# Patient Record
Sex: Female | Born: 1990 | Race: Black or African American | Hispanic: No | Marital: Married | Smoking: Never smoker
Health system: Southern US, Community
[De-identification: ages and names within clinical notes are randomized; demographics above are authoritative.]

## PROBLEM LIST (undated history)

## (undated) ENCOUNTER — Inpatient Hospital Stay (HOSPITAL_COMMUNITY): Payer: Self-pay

## (undated) DIAGNOSIS — I10 Essential (primary) hypertension: Secondary | ICD-10-CM

## (undated) DIAGNOSIS — R040 Epistaxis: Secondary | ICD-10-CM

## (undated) HISTORY — DX: Essential (primary) hypertension: I10

## (undated) HISTORY — PX: WISDOM TOOTH EXTRACTION: SHX21

---

## 2014-10-08 NOTE — L&D Delivery Note (Signed)
  Vaginal Delivery Note The pt utilized an epidural as pain management.   Artificial rupture of membranes today, at 1552, clear.  GBS negative.  Cervical dilation was complete at 1810.  NICHD Category 1.    Pushing with guidance began at  1825.   After 12 minutes of pushing the head, shoulders and the body of a viable female infant "Beth Meadows" delivered spontaneously with maternal effort in the LOA position at 1837.   With vigorous tone and spontaneous cry, the infant was placed on moms abd.  After the umbilical cord was clamped it was cut by the FOB, then cord blood was obtained for evaluation. Spontaneous delivery of a intact placenta with a 3 vessel cord via [facility] [employer].   Episiotomy: None   The vulva, perineum, vaginal vault, rectum and cervix were inspected and revealed a 1 degree vaginal which was repair using a 3-0 vicryl on a CT needle and a superficial bilateral inner labial,  bilateral periurethral laceration and a periclitoral, both repaired using a 4-0 vicryl on a SH. Lidocaine was not used, the epidural was sufficient for the repair.    Postpartum pitocin as ordered.  Fundus firm, lochia minimum, bleeding under control. EBL 250, Pt hemodynamically stable.   Sponge, laps and needle count correct and verified with the primary care nurse.  Attending MD available at all times.    Routine postpartum orders   Mother desires Nexplanon for contraception   Placenta to pathology: NO     Cord Gases sent to lab: NO Cord blood sent to lab: YES   APGARS:  8 at 1 minute and 9 at 5 minutes Weight:.      Both mom and baby were left in stable condition, baby skin to skin.      Jyair Kiraly, CNM, MSN 06/01/2015. 7:32 PM

## 2014-11-01 ENCOUNTER — Inpatient Hospital Stay (HOSPITAL_COMMUNITY)
Admission: AD | Admit: 2014-11-01 | Discharge: 2014-11-01 | Disposition: A | Discharge status: Home / Self Care | Payer: Medicaid Other | Source: Ambulatory Visit | Attending: Obstetrics & Gynecology | Admitting: Obstetrics & Gynecology

## 2014-11-01 ENCOUNTER — Inpatient Hospital Stay (HOSPITAL_COMMUNITY): Payer: Medicaid Other

## 2014-11-01 ENCOUNTER — Encounter (HOSPITAL_COMMUNITY): Payer: Self-pay | Admitting: *Deleted

## 2014-11-01 DIAGNOSIS — O211 Hyperemesis gravidarum with metabolic disturbance: Secondary | ICD-10-CM | POA: Insufficient documentation

## 2014-11-01 DIAGNOSIS — O219 Vomiting of pregnancy, unspecified: Secondary | ICD-10-CM | POA: Diagnosis not present

## 2014-11-01 DIAGNOSIS — R1031 Right lower quadrant pain: Secondary | ICD-10-CM | POA: Insufficient documentation

## 2014-11-01 DIAGNOSIS — R109 Unspecified abdominal pain: Secondary | ICD-10-CM

## 2014-11-01 DIAGNOSIS — O210 Mild hyperemesis gravidarum: Secondary | ICD-10-CM | POA: Diagnosis present

## 2014-11-01 DIAGNOSIS — O26899 Other specified pregnancy related conditions, unspecified trimester: Secondary | ICD-10-CM

## 2014-11-01 DIAGNOSIS — Z3A09 9 weeks gestation of pregnancy: Secondary | ICD-10-CM | POA: Insufficient documentation

## 2014-11-01 HISTORY — DX: Epistaxis: R04.0

## 2014-11-01 LAB — URINALYSIS, ROUTINE W REFLEX MICROSCOPIC
BILIRUBIN URINE: NEGATIVE
Glucose, UA: NEGATIVE mg/dL
Hgb urine dipstick: NEGATIVE
Ketones, ur: >80 mg/dL — AB
Leukocytes, UA: NEGATIVE
Nitrite: NEGATIVE
PH: 6 (ref 5.0–8.0)
PROTEIN: NEGATIVE mg/dL
UROBILINOGEN UA: 0.2 mg/dL (ref 0.0–1.0)

## 2014-11-01 LAB — POCT PREGNANCY, URINE: Preg Test, Ur: POSITIVE — AB

## 2014-11-01 MED ORDER — PROMETHAZINE HCL 25 MG/ML IJ SOLN
25.0000 mg | Freq: Once | INTRAMUSCULAR | Status: AC
  Administered 2014-11-01: 25 mg via INTRAVENOUS
  Filled 2014-11-01: qty 1

## 2014-11-01 MED ORDER — LACTATED RINGERS IV BOLUS (SEPSIS)
1000.0000 mL | Freq: Once | INTRAVENOUS | Status: AC
  Administered 2014-11-01: 1000 mL via INTRAVENOUS

## 2014-11-01 MED ORDER — DOXYLAMINE-PYRIDOXINE 10-10 MG PO TBEC: 2.0000 | DELAYED_RELEASE_TABLET | Freq: Every evening | ORAL | Status: DC | PRN

## 2014-11-01 MED ORDER — LACTATED RINGERS IV SOLN
INTRAVENOUS | Status: DC
  Administered 2014-11-01: 14:00:00 via INTRAVENOUS

## 2014-11-01 NOTE — MAU Note (Signed)
+  HPT, confirmed a couple wks ago at urgent care.  Has been vomiting several times a day, can't keep any food down.

## 2014-11-01 NOTE — MAU Provider Note (Signed)
History     CSN: [id number]  Arrival date and time: 11/01/14 1226   None     Chief Complaint  Patient presents with  . Emesis   HPI This is a 24 y.o. female at [redacted]w[redacted]d who presents with c/o nausea and vomting which has worsened this week. Has been a problem for 2 weeks but got worse today. States was seen at Urgent care 2 wks ago for RLQ pain. They did Quant HCG levels and states they did double. But they never did an [place] to r/o ectopic.   RN Note: +HPT, confirmed a couple wks ago at urgent care. Has been vomiting several times a day, can't keep any food down.          OB History    Gravida Para Term Preterm AB TAB SAB Ectopic Multiple Living   1               Past Medical History  Diagnosis Date  . Frequent nosebleeds     Past Surgical History  Procedure Laterality Date  . Wisdom tooth extraction      History reviewed. No pertinent family history.  History  Substance Use Topics  . Smoking status: Never Smoker   . Smokeless tobacco: Never Used  . Alcohol Use: No    Allergies: No Known Allergies  No prescriptions prior to admission    Review of Systems  Constitutional: Positive for malaise/fatigue. Negative for fever and chills.  Gastrointestinal: Positive for nausea, vomiting and abdominal pain (Intermittent RLQ pain, not often now).  Genitourinary:       Denies bleeding   Neurological: Negative for dizziness and headaches.   Physical Exam   Blood pressure 134/77, pulse 94, temperature 99.1 F (37.3 C), temperature source Oral, resp. rate 18, weight 165 lb (74.844 kg), last menstrual period 09/01/2014.  Physical Exam  Constitutional: She is oriented to person, place, and time. She appears well-developed and well-nourished. No distress.  HENT:  Head: Normocephalic.  Cardiovascular: Normal rate.   Respiratory: Effort normal.  GI: Soft. She exhibits no distension. There is no tenderness. There is no rebound and no guarding.  Musculoskeletal:  Normal range of motion.  Neurological: She is alert and oriented to person, place, and time.  Skin: Skin is warm and dry.  Psychiatric: She has a normal mood and affect.    MAU Course  Procedures  MDM Will give IV hydration and Phenergan. Then will d/c home with Rx for Diclegis  >> feels better, tolerating PO Will go ahead and get an [place] to confirm this pregnancy is intrauterine.  US Ob Comp Less 14 Wks  11/01/2014   CLINICAL DATA:  Pregnant, right lower quadrant pain  EXAM: OBSTETRIC <14 WK [place] AND TRANSVAGINAL OB US  TECHNIQUE: Both transabdominal and transvaginal ultrasound examinations were performed for complete evaluation of the gestation as well as the maternal uterus, adnexal regions, and pelvic cul-de-sac. Transvaginal technique was performed to assess early pregnancy.  COMPARISON:  None.  FINDINGS: Intrauterine gestational sac: Visualized/normal in shape.  Yolk sac:  Present  Embryo:  Present  Cardiac Activity: Present  Heart Rate:  172 bpm  CRL:   25.2  mm   9 w 2 d                  [place] EDC: 06/04/2015  Maternal uterus/adnexae: No subchorionic hemorrhage.  Left ovary is within normal limits. Right ovary is not discretely visualized.  No free fluid.  IMPRESSION: Single live intrauterine  gestation with estimated gestational age [redacted] weeks 2 days by crown-rump length.   Electronically Signed   By: Charline BillsSriyesh  Krishnan M.D.   On: 11/01/2014 14:44   [place] Ob Transvaginal  11/01/2014   CLINICAL DATA:  Pregnant, right lower quadrant pain  EXAM: OBSTETRIC <14 WK US AND TRANSVAGINAL OB US  TECHNIQUE: Both transabdominal and transvaginal ultrasound examinations were performed for complete evaluation of the gestation as well as the maternal uterus, adnexal regions, and pelvic cul-de-sac. Transvaginal technique was performed to assess early pregnancy.  COMPARISON:  None.  FINDINGS: Intrauterine gestational sac: Visualized/normal in shape.  Yolk sac:  Present  Embryo:  Present  Cardiac Activity: Present  Heart  Rate:  172 bpm  CRL:   25.2  mm   9 w 2 d                  US EDC: 06/04/2015  Maternal uterus/adnexae: No subchorionic hemorrhage.  Left ovary is within normal limits. Right ovary is not discretely visualized.  No free fluid.  IMPRESSION: Single live intrauterine gestation with estimated gestational age [redacted] weeks 2 days by crown-rump length.   Electronically Signed   By: Charline BillsSriyesh  Krishnan M.D.   On: 11/01/2014 14:44    Assessment and Plan  A:  SIUP at 975w2d      Nausea and vomiting of pregnancy      Mild to moderate dehydration  P:  Discharge home      Encouraged to start prenatal care  [facility] HospitalWILLIAMS,Kreig Parson 11/01/2014, 1:52 PM

## 2014-11-01 NOTE — Discharge Instructions (Signed)
Morning Sickness °Morning sickness is when you feel sick to your stomach (nauseous) during pregnancy. This nauseous feeling may or may not come with vomiting. It often occurs in the morning but can be a problem any time of day. Morning sickness is most common during the first trimester, but it may continue throughout pregnancy. While morning sickness is unpleasant, it is usually harmless unless you develop severe and continual vomiting (hyperemesis gravidarum). This condition requires more intense treatment.  °CAUSES  °The cause of morning sickness is not completely known but seems to be related to normal hormonal changes that occur in pregnancy. °RISK FACTORS °You are at greater risk if you: °· Experienced nausea or vomiting before your pregnancy. °· Had morning sickness during a previous pregnancy. °· Are pregnant with more than one baby, such as twins. °TREATMENT  °Do not use any medicines (prescription, over-the-counter, or herbal) for morning sickness without first talking to your health care provider. Your health care provider may prescribe or recommend: °· Vitamin B6 supplements. °· Anti-nausea medicines. °· The herbal medicine ginger. °HOME CARE INSTRUCTIONS  °· Only take over-the-counter or prescription medicines as directed by your health care provider. °· Taking multivitamins before getting pregnant can prevent or decrease the severity of morning sickness in most women. °· Eat a piece of dry toast or unsalted crackers before getting out of bed in the morning. °· Eat five or six small meals a day. °· Eat dry and bland foods (rice, baked potato). Foods high in carbohydrates are often helpful. °· Do not drink liquids with your meals. Drink liquids between meals. °· Avoid greasy, fatty, and spicy foods. °· Get someone to cook for you if the smell of any food causes nausea and vomiting. °· If you feel nauseous after taking prenatal vitamins, take the vitamins at night or with a snack.  °· Snack on protein  foods (nuts, yogurt, cheese) between meals if you are hungry. °· Eat unsweetened gelatins for desserts. °· Wearing an acupressure wristband (worn for sea sickness) may be helpful. °· Acupuncture may be helpful. °· Do not smoke. °· Get a humidifier to keep the air in your house free of odors. °· Get plenty of fresh air. °SEEK MEDICAL CARE IF:  °· Your home remedies are not working, and you need medicine. °· You feel dizzy or lightheaded. °· You are losing weight. °SEEK IMMEDIATE MEDICAL CARE IF:  °· You have persistent and uncontrolled nausea and vomiting. °· You pass out (faint). °MAKE SURE YOU: °· Understand these instructions. °· Will watch your condition. °· Will get help right away if you are not doing well or get worse. °Document Released: 11/15/2006 Document Revised: 09/29/2013 Document Reviewed: 03/11/2013 °ExitCare® Patient Information ©2015 [employer]. This information is not intended to replace advice given to you by your health care provider. Make sure you discuss any questions you have with your health care provider. ° ° °DICLEGIS INSTRUCTIONS: ° °Take 2 tablets at bedtime.  ° °If symptoms persist, add 1 tab in the AM starting on day 3. ° ° If symptoms persist, add 1 tab in the PM starting day 4.  ° ° °

## 2014-12-16 LAB — OB RESULTS CONSOLE HEPATITIS B SURFACE ANTIGEN: HEP B S AG: NEGATIVE

## 2014-12-16 LAB — OB RESULTS CONSOLE GC/CHLAMYDIA
Chlamydia: NEGATIVE
Gonorrhea: NEGATIVE

## 2014-12-16 LAB — OB RESULTS CONSOLE RPR: RPR: NONREACTIVE

## 2014-12-16 LAB — OB RESULTS CONSOLE ABO/RH: RH Type: POSITIVE

## 2014-12-16 LAB — OB RESULTS CONSOLE ANTIBODY SCREEN: ANTIBODY SCREEN: NEGATIVE

## 2014-12-16 LAB — OB RESULTS CONSOLE HIV ANTIBODY (ROUTINE TESTING): HIV: NONREACTIVE

## 2014-12-16 LAB — OB RESULTS CONSOLE RUBELLA ANTIBODY, IGM: RUBELLA: IMMUNE

## 2015-04-29 LAB — OB RESULTS CONSOLE GBS: GBS: NEGATIVE

## 2015-06-01 ENCOUNTER — Inpatient Hospital Stay (HOSPITAL_COMMUNITY): Payer: Medicaid Other | Admitting: Anesthesiology

## 2015-06-01 ENCOUNTER — Inpatient Hospital Stay (HOSPITAL_COMMUNITY)
Admission: AD | Admit: 2015-06-01 | Discharge: 2015-06-03 | DRG: 775 | Disposition: A | Discharge status: Home / Self Care | Payer: Medicaid Other | Source: Ambulatory Visit | Attending: Obstetrics & Gynecology | Admitting: Obstetrics & Gynecology

## 2015-06-01 ENCOUNTER — Encounter (HOSPITAL_COMMUNITY): Payer: Self-pay | Admitting: *Deleted

## 2015-06-01 DIAGNOSIS — O368132 Decreased fetal movements, third trimester, fetus 2: Secondary | ICD-10-CM

## 2015-06-01 DIAGNOSIS — Z3A39 39 weeks gestation of pregnancy: Secondary | ICD-10-CM | POA: Diagnosis present

## 2015-06-01 LAB — COMPREHENSIVE METABOLIC PANEL
ALK PHOS: 117 U/L (ref 38–126)
ALT: 11 U/L — ABNORMAL LOW (ref 14–54)
ANION GAP: 9 (ref 5–15)
AST: 19 U/L (ref 15–41)
Albumin: 3.2 g/dL — ABNORMAL LOW (ref 3.5–5.0)
BILIRUBIN TOTAL: 0.6 mg/dL (ref 0.3–1.2)
BUN: 7 mg/dL (ref 6–20)
CALCIUM: 9.1 mg/dL (ref 8.9–10.3)
CO2: 21 mmol/L — ABNORMAL LOW (ref 22–32)
Chloride: 105 mmol/L (ref 101–111)
Creatinine, Ser: 0.71 mg/dL (ref 0.44–1.00)
Glucose, Bld: 108 mg/dL — ABNORMAL HIGH (ref 65–99)
POTASSIUM: 3.5 mmol/L (ref 3.5–5.1)
Sodium: 135 mmol/L (ref 135–145)
TOTAL PROTEIN: 6.8 g/dL (ref 6.5–8.1)

## 2015-06-01 LAB — PROTEIN / CREATININE RATIO, URINE
CREATININE, URINE: 144 mg/dL
Protein Creatinine Ratio: 0.06 mg/mg{Cre} (ref 0.00–0.15)
TOTAL PROTEIN, URINE: 9 mg/dL

## 2015-06-01 LAB — URIC ACID: URIC ACID, SERUM: 4.7 mg/dL (ref 2.3–6.6)

## 2015-06-01 LAB — CBC
HEMATOCRIT: 36.4 % (ref 36.0–46.0)
HEMOGLOBIN: 12.9 g/dL (ref 12.0–15.0)
MCH: 31.9 pg (ref 26.0–34.0)
MCHC: 35.4 g/dL (ref 30.0–36.0)
MCV: 90.1 fL (ref 78.0–100.0)
Platelets: 177 10*3/uL (ref 150–400)
RBC: 4.04 MIL/uL (ref 3.87–5.11)
RDW: 14.1 % (ref 11.5–15.5)
WBC: 12.7 10*3/uL — ABNORMAL HIGH (ref 4.0–10.5)

## 2015-06-01 LAB — TYPE AND SCREEN
ABO/RH(D): O POS
ANTIBODY SCREEN: NEGATIVE

## 2015-06-01 LAB — ABO/RH: ABO/RH(D): O POS

## 2015-06-01 LAB — LACTATE DEHYDROGENASE: LDH: 141 U/L (ref 98–192)

## 2015-06-01 MED ORDER — OXYCODONE-ACETAMINOPHEN 5-325 MG PO TABS: 1.0000 | ORAL_TABLET | ORAL | Status: DC | PRN

## 2015-06-01 MED ORDER — FENTANYL 2.5 MCG/ML BUPIVACAINE 1/10 % EPIDURAL INFUSION (WH - ANES)
14.0000 mL/h | INTRAMUSCULAR | Status: DC | PRN
  Administered 2015-06-01 (×2): 14 mL/h via EPIDURAL
  Filled 2015-06-01 (×2): qty 125

## 2015-06-01 MED ORDER — DIPHENHYDRAMINE HCL 25 MG PO CAPS: 25.0000 mg | ORAL_CAPSULE | Freq: Four times a day (QID) | ORAL | Status: DC | PRN

## 2015-06-01 MED ORDER — ZOLPIDEM TARTRATE 5 MG PO TABS: 5.0000 mg | ORAL_TABLET | Freq: Every evening | ORAL | Status: DC | PRN

## 2015-06-01 MED ORDER — LIDOCAINE HCL (PF) 1 % IJ SOLN
30.0000 mL | INTRAMUSCULAR | Status: DC | PRN
  Filled 2015-06-01: qty 30

## 2015-06-01 MED ORDER — ONDANSETRON HCL 4 MG PO TABS: 4.0000 mg | ORAL_TABLET | ORAL | Status: DC | PRN

## 2015-06-01 MED ORDER — FLEET ENEMA 7-19 GM/118ML RE ENEM: 1.0000 | ENEMA | RECTAL | Status: DC | PRN

## 2015-06-01 MED ORDER — OXYCODONE-ACETAMINOPHEN 5-325 MG PO TABS: 2.0000 | ORAL_TABLET | ORAL | Status: DC | PRN

## 2015-06-01 MED ORDER — ACETAMINOPHEN 325 MG PO TABS
650.0000 mg | ORAL_TABLET | ORAL | Status: DC | PRN
Start: 2015-06-01 — End: 2015-06-01

## 2015-06-01 MED ORDER — OXYTOCIN BOLUS FROM INFUSION: 500.0000 mL | INTRAVENOUS | Status: DC

## 2015-06-01 MED ORDER — CITRIC ACID-SODIUM CITRATE 334-500 MG/5ML PO SOLN: 30.0000 mL | ORAL | Status: DC | PRN

## 2015-06-01 MED ORDER — LIDOCAINE HCL (PF) 1 % IJ SOLN
INTRAMUSCULAR | Status: DC | PRN
  Administered 2015-06-01: 6 mL via EPIDURAL
  Administered 2015-06-01: 4 mL

## 2015-06-01 MED ORDER — PHENYLEPHRINE 40 MCG/ML (10ML) SYRINGE FOR IV PUSH (FOR BLOOD PRESSURE SUPPORT)
80.0000 ug | PREFILLED_SYRINGE | INTRAVENOUS | Status: DC | PRN
  Filled 2015-06-01: qty 20
  Filled 2015-06-01: qty 2

## 2015-06-01 MED ORDER — LACTATED RINGERS IV SOLN
INTRAVENOUS | Status: DC
  Administered 2015-06-01 (×2): via INTRAVENOUS

## 2015-06-01 MED ORDER — ONDANSETRON HCL 4 MG/2ML IJ SOLN
4.0000 mg | Freq: Four times a day (QID) | INTRAMUSCULAR | Status: DC | PRN
  Administered 2015-06-01: 4 mg via INTRAVENOUS
  Filled 2015-06-01: qty 2

## 2015-06-01 MED ORDER — OXYTOCIN 40 UNITS IN LACTATED RINGERS INFUSION - SIMPLE MED
62.5000 mL/h | INTRAVENOUS | Status: DC
  Filled 2015-06-01: qty 1000

## 2015-06-01 MED ORDER — DIBUCAINE 1 % RE OINT: 1.0000 "application " | TOPICAL_OINTMENT | RECTAL | Status: DC | PRN

## 2015-06-01 MED ORDER — FENTANYL 2.5 MCG/ML BUPIVACAINE 1/10 % EPIDURAL INFUSION (WH - ANES): 14.0000 mL/h | INTRAMUSCULAR | Status: DC | PRN

## 2015-06-01 MED ORDER — ONDANSETRON HCL 4 MG/2ML IJ SOLN: 4.0000 mg | INTRAMUSCULAR | Status: DC | PRN

## 2015-06-01 MED ORDER — BENZOCAINE-MENTHOL 20-0.5 % EX AERO
1.0000 "application " | INHALATION_SPRAY | CUTANEOUS | Status: DC | PRN
  Administered 2015-06-02: 1 via TOPICAL
  Filled 2015-06-01: qty 56

## 2015-06-01 MED ORDER — SENNOSIDES-DOCUSATE SODIUM 8.6-50 MG PO TABS
2.0000 | ORAL_TABLET | ORAL | Status: DC
  Administered 2015-06-02: 2 via ORAL
  Filled 2015-06-01: qty 2

## 2015-06-01 MED ORDER — PRENATAL MULTIVITAMIN CH
1.0000 | ORAL_TABLET | Freq: Every day | ORAL | Status: DC
  Administered 2015-06-02: 1 via ORAL
  Filled 2015-06-01: qty 1

## 2015-06-01 MED ORDER — WITCH HAZEL-GLYCERIN EX PADS: 1.0000 "application " | MEDICATED_PAD | CUTANEOUS | Status: DC | PRN

## 2015-06-01 MED ORDER — MISOPROSTOL 200 MCG PO TABS
ORAL_TABLET | ORAL | Status: AC
  Filled 2015-06-01: qty 5

## 2015-06-01 MED ORDER — DIPHENHYDRAMINE HCL 50 MG/ML IJ SOLN: 12.5000 mg | INTRAMUSCULAR | Status: DC | PRN

## 2015-06-01 MED ORDER — IBUPROFEN 600 MG PO TABS
600.0000 mg | ORAL_TABLET | Freq: Four times a day (QID) | ORAL | Status: DC
  Administered 2015-06-02 – 2015-06-03 (×6): 600 mg via ORAL
  Filled 2015-06-01 (×6): qty 1

## 2015-06-01 MED ORDER — LANOLIN HYDROUS EX OINT: TOPICAL_OINTMENT | CUTANEOUS | Status: DC | PRN

## 2015-06-01 MED ORDER — EPHEDRINE 5 MG/ML INJ
10.0000 mg | INTRAVENOUS | Status: DC | PRN
  Filled 2015-06-01: qty 2

## 2015-06-01 MED ORDER — ACETAMINOPHEN 325 MG PO TABS: 650.0000 mg | ORAL_TABLET | ORAL | Status: DC | PRN

## 2015-06-01 MED ORDER — SIMETHICONE 80 MG PO CHEW: 80.0000 mg | CHEWABLE_TABLET | ORAL | Status: DC | PRN

## 2015-06-01 MED ORDER — LACTATED RINGERS IV SOLN: 500.0000 mL | INTRAVENOUS | Status: DC | PRN

## 2015-06-01 MED ORDER — TETANUS-DIPHTH-ACELL PERTUSSIS 5-2.5-18.5 LF-MCG/0.5 IM SUSP: 0.5000 mL | Freq: Once | INTRAMUSCULAR | Status: DC

## 2015-06-01 NOTE — Progress Notes (Signed)
Labor Progress  Subjective: Comfortable with epidural, c/o increase pressure  Objective: BP 128/75 mmHg  Pulse 92  Temp(Src) 98.2 F (36.8 C) (Oral)  Resp 18  Ht  (1.676 m)  Wt 191 lb (86.637 kg)  BMI 30.84 kg/m2  SpO2 100%  LMP 09/01/2014 (Exact Date)     FHT: 140, moderate variability, + accel, no decel CTX:  regular, every 3-4 minutes Uterus gravid, soft non tender SVE:  8/100/-1   Assessment:  IUP at 39.1 weeks NICHD: Category  Membranes:  AROM at 1552 Labor progress: adquate labor GBS: negative    Plan: Continue labor plan Continuous monitoring Rest Ambulate Frequent position changes to facilitate fetal rotation and descent. Will reassess with cervical exam at 1700 or earlier if necessary      Beth Meadows, CNM, MSN 06/01/2015. 5:03 PM

## 2015-06-01 NOTE — Anesthesia Preprocedure Evaluation (Signed)

## 2015-06-01 NOTE — MAU Provider Note (Signed)
Beth Meadows is a 24 y.o. G1P0 at 39.0 weeks ctx since 1am   History     There are no active problems to display for this patient.   Chief Complaint  Patient presents with  . Labor Eval   HPI  OB History    Gravida Para Term Preterm AB TAB SAB Ectopic Multiple Living   1               Past Medical History  Diagnosis Date  . Frequent nosebleeds     Past Surgical History  Procedure Laterality Date  . Wisdom tooth extraction      History reviewed. No pertinent family history.  Social History  Substance Use Topics  . Smoking status: Never Smoker   . Smokeless tobacco: Never Used  . Alcohol Use: No    Allergies: No Known Allergies  Prescriptions prior to admission  Medication Sig Dispense Refill Last Dose  . IRON PO Take 1 tablet by mouth daily.   05/31/2015 at Unknown time  . Prenatal Vit-Fe Fumarate-FA (PRENATAL MULTIVITAMIN) TABS tablet Take 1 tablet by mouth at bedtime.   05/31/2015 at Unknown time  . Doxylamine-Pyridoxine (DICLEGIS) 10-10 MG TBEC Take 2 tablets by mouth at bedtime as needed. (Patient not taking: Reported on 06/01/2015) 100 tablet 2     ROS See HPI above, all other systems are negative  Physical Exam   Blood pressure 142/90, pulse 111, temperature 97.8 F (36.6 C), temperature source Oral, resp. rate 18, last menstrual period 09/01/2014.  Physical Exam Ext:  WNL ABD: Soft, non tender to palpation, no rebound or guarding SVE: 3/100/-3, anterior   ED Course  Assessment: IUP at  39.0weeks Membranes: intact FHR: Category 2 CTX:  occasional   Plan:  Labs: PIH labs Admit to L&D   Cydnee Fuquay, CNM, MSN 06/01/2015. 9:00 AM

## 2015-06-01 NOTE — Anesthesia Procedure Notes (Signed)

## 2015-06-01 NOTE — H&P (Signed)
Beth Meadows is a 24 y.o. female, G1 P0 at 39.1 weeks  There are no active problems to display for this patient.   Pregnancy Course: Patient entered care at 16.3 weeks.   EDC of 06/07/15 was established by [place].      [place] evaluations:   9.2 weeks - Dating: YS present, FHR 172, EGA 9.2, no SCH  20.0 weeks - Anatomy: EFW 11oz - 80%, FHR 150, cervix 3.52, posterior placenta, no previa, female        Significant prenatal events:   anemia   Last evaluation:   39.0 weeks   VE:0/10/-3   Reason for admission:  labor  Pt States:   Contractions Frequency: 3-4         Contraction severity: mild         Fetal activity: +FM  OB History    Gravida Para Term Preterm AB TAB SAB Ectopic Multiple Living   1              Past Medical History  Diagnosis Date  . Frequent nosebleeds    Past Surgical History  Procedure Laterality Date  . Wisdom tooth extraction     Family History: family history is not on file. Social History:  reports that she has never smoked. She has never used smokeless tobacco. She reports that she does not drink alcohol or use illicit drugs.   Prenatal Transfer Tool  Maternal Diabetes: No Genetic Screening:  Maternal Ultrasounds/Referrals: Normal Fetal Ultrasounds or other Referrals:  None Maternal Substance Abuse:  No Significant Maternal Medications:  None Significant Maternal Lab Results: None   ROS:  See HPI above, all other systems are negative  No Known Allergies  Dilation: 3.5 Effacement (%): 70 Station: -3 Exam by:: Dorrene German RN Blood pressure 120/68, pulse 126, temperature 97.8 F (36.6 C), temperature source Oral, resp. rate 18, last menstrual period 09/01/2014.  Maternal Exam:  Uterine Assessment: Contraction frequency is rare.  Abdomen: Gravid, non tender. Fundal height is aga.  Normal external genitalia, vulva, cervix, uterus and adnexa.  No lesions noted on exam.  Pelvis adequate for delivery.  Fetal presentation: Vertex by Leopold's    Fetal Exam:  Monitor Surveillance : Continuous Monitoring Mode: Ultrasound.  NICHD: Category 2, min-mod variability, + accel, no decel CTXs: Q 3-65minutes mild EFW   7 lbs  Physical Exam: Nursing note and vitals reviewed General: alert and cooperative She appears well nourished Psychiatric: Normal mood and affect. Her behavior is normal Head: Normocephalic Eyes: Pupils are equal, round, and reactive to light Neck: Normal range of motion Cardiovascular: RRR without murmur  Respiratory: CTAB. Effort normal  Abd: soft, non-tender, +BS, no rebound, no guarding  Genitourinary: Vagina normal  Neurological: A&Ox3 Skin: Warm and dry  Musculoskeletal: Normal range of motion  Homan's sign negative bilaterally No evidence of DVTs.  Edema: Minimal bilaterally non-pitting edema DTR: 2+ Clonus: None   Prenatal labs: ABO, Rh:  O positive Antibody:  negative Rubella:   immune RPR:   NR HBsAg:   negative HIV:   NR GBS:  negative Sickle cell/Hgb electrophoresis:  WNL Pap:  neg with fungal infection 12/29/14 GC:   negative Chlamydia: negative Genetic screenings:   Glucola:  negative   Assessment:  IUP at 39.0 weeks NICHD: Category Membranes: BBW GBS negative  Plan:  Admit to L&D for expectant management of labor. Possible augmentation options reviewed including AROM and/or pitocin.  IV pain medication per orders PRN Epidural per patient request Foley cath after patient  is comfortable with epidural Anticipate SVD Labor mgmt as ordered Okay to ambulate around unit with wireless monitors  Okay to get up and shower without monitoring   May auscultate FHR intermittently,  if expectant management     q 30 min in active labor - x 5 minutes     q 15 min in transition - before during and after a ctx     q 5 min with pushing - before during and after a ctx.     May ambulate without monitoring.     If no active labor, may do NST q 2 hours.      Attending MD available at  all times.  Bijal Siglin, CNM, MSN 06/01/2015, 9:19 AM

## 2015-06-01 NOTE — MAU Note (Signed)
Pt C/O regular uc's since 0100 this a.m., denies bleeding or LOF.

## 2015-06-02 LAB — CBC
HCT: 30.2 % — ABNORMAL LOW (ref 36.0–46.0)
HEMOGLOBIN: 10.6 g/dL — AB (ref 12.0–15.0)
MCH: 31.7 pg (ref 26.0–34.0)
MCHC: 35.1 g/dL (ref 30.0–36.0)
MCV: 90.4 fL (ref 78.0–100.0)
PLATELETS: 169 10*3/uL (ref 150–400)
RBC: 3.34 MIL/uL — ABNORMAL LOW (ref 3.87–5.11)
RDW: 14.3 % (ref 11.5–15.5)
WBC: 19.1 10*3/uL — ABNORMAL HIGH (ref 4.0–10.5)

## 2015-06-02 LAB — RPR: RPR: NONREACTIVE

## 2015-06-02 MED ORDER — FERROUS SULFATE 325 (65 FE) MG PO TABS
325.0000 mg | ORAL_TABLET | Freq: Two times a day (BID) | ORAL | Status: DC
  Administered 2015-06-02: 325 mg via ORAL
  Filled 2015-06-02 (×2): qty 1

## 2015-06-02 NOTE — Progress Notes (Addendum)
Beth Meadows   Subjective: Post Partum Day 1 Vaginal delivery, 1 degree vaginal, a superficial bilateral inner labial, bilateral periurethral laceration and a periclitoral Patient up ad lib, denies syncope or dizziness. Reports consuming regular diet without issues and denies N/V No issues with urination and reports bleeding is appropriate  Feeding:  breast Contraceptive plan:   Nexplanon  Objective: Temp:  [97.7 F (36.5 C)-98.9 F (37.2 C)] 98.4 F (36.9 C) (08/25 0525) Pulse Rate:  [91-226] 105 (08/25 0525) Resp:  [18-20] 18 (08/25 0525) BP: (110-160)/(61-105) 111/61 mmHg (08/25 0525) SpO2:  [90 %-100 %] 100 % (08/24 1238) Weight:  [191 lb (86.637 kg)] 191 lb (86.637 kg) (08/24 1111)  Physical Exam:  General: alert and cooperative Ext: WNL, no significant  edema. No evidence of DVT seen on physical exam. Breast: Soft filling Lungs: CTAB Heart RRR without murmur  Abdomen:  Soft, fundus firm, lochia scant, + bowel sounds, non distended, non tender Lochia: appropriate Uterine Fundus: firm Laceration: healing well    Recent Labs  06/01/15 0820 06/02/15 0620  HGB 12.9 10.6*  HCT 36.4 30.2*    Assessment S/P Vaginal Delivery-Day 1 Stable  Normal Involution Breastfeeding   Plan: Continue current care Plan for discharge tomorrow and Breastfeeding Lactation support Iron supplement   Ambera Fedele, CNM, MSN 06/02/2015, 7:55 AM

## 2015-06-02 NOTE — Lactation Note (Signed)
This note was copied from the chart of Beth Meadows. Lactation Consultation Note  Patient Name: Beth Meadows [id number]'[id number] Date: 06/02/2015 Reason for consult: Initial assessment with this first time mom of a term baby, now 96 hours old. MOm is doing very well with breastfeeding. She has been latching in cradle and reports her nipple being painful at times. I showed mom how to latch in both football and cross cradle, and how to obtain a deep latch. Mom very receptive to teaching. Lactation services reviewed with mom, as well as the breastfeeding pages in the Baby and me book. Mom knows to call for questions/concerns.    Maternal Data Formula Feeding for Exclusion: No Has patient been taught Hand Expression?: Yes Does the patient have breastfeeding experience prior to this delivery?: No  Feeding Feeding Type: Breast Fed Length of feed: 10 min  LATCH Score/Interventions                      Lactation Tools Discussed/Used     Consult Status Consult Status: Follow-up Date: 06/03/15 Follow-up type: In-patient    Alfred Levins 06/02/2015, 4:35 PM

## 2015-06-02 NOTE — Progress Notes (Signed)
UR chart review completed.  

## 2015-06-02 NOTE — Anesthesia Postprocedure Evaluation (Signed)
Anesthesia Post Note  Patient: Beth Meadows  Procedure(s) Performed: * No procedures listed *  Anesthesia type: Epidural  Patient location: Mother/Baby  Post pain: Pain level controlled  Post assessment: Post-op Vital signs reviewed  Last Vitals:  Filed Vitals:   06/02/15 0525  BP: 111/61  Pulse: 105  Temp: 36.9 C  Resp: 18    Post vital signs: Reviewed  Level of consciousness: awake  Complications: No apparent anesthesia complications

## 2015-06-03 LAB — CCBB MATERNAL DONOR DRAW

## 2015-06-03 MED ORDER — IBUPROFEN 600 MG PO TABS: 600.0000 mg | ORAL_TABLET | Freq: Four times a day (QID) | ORAL | Status: DC | PRN

## 2015-06-03 NOTE — Discharge Summary (Signed)
Vaginal Delivery Discharge Summary  Beth Meadows  DOB:    June 16, 1991 MRN:    [id number] CSN:    [id number]  Date of admission:                  June 01, 2015  Date of discharge:                   June 03, 2015  Procedures this admission:   SVD with Repair of 1st Degree Vaginal, Bilateral Labial, Bilateral Periurethral, and Periclitoral Lacerations  Date of Delivery: June 01, 2015  Newborn Data:  Live born female  Birth Weight: 7 lb 2.2 oz (3238 g) APGAR: 8, 9  Home with mother. Name: [place] Circumcision Plan: N/A  History of Present Illness:  Beth Meadows is a 24 y.o. female, G1P1001, who presents at [redacted]w[redacted]d weeks gestation. The patient has been followed at [facility]. She was admitted onset of labor. Her pregnancy has been complicated by:  Patient Active Problem List   Diagnosis Date Noted  . Obstetric vaginal laceration, delivered, current hospitalization 06/03/2015  . Periurethral trauma during delivery 06/03/2015  . Obstetric labial laceration, delivered, current hospitalization 06/03/2015  . Obstetrical laceration 06/03/2015  . Vaginal delivery 06/01/2015     Hospital Course:  Admitted in active labor. Negative GBS. Progressed  With AROM for augmentation. Utilized epidural for pain management.  Delivery was performed by V. Standard, CNm without complication. Patient and baby tolerated the procedure without difficulty, with  multiple lacerations noted. Infant status was stable and remained in room with mother.  Mother and infant then had an uncomplicated postpartum course, with breast feeding going well. Mom's physical exam was WNL, and she was discharged home in stable condition. Contraception plan was Nexplanon.  She received adequate benefit from po pain medications.   Feeding:  breast  Contraception:  Nexplanon  Hemoglobin Results:  CBC Latest Ref Rng 06/02/2015 06/01/2015   WBC 4.0 - 10.5 K/uL 19.1(H) 12.7(H)  Hemoglobin 12.0 - 15.0 g/dL 10.6(L) 12.9  Hematocrit 36.0 - 46.0 % 30.2(L) 36.4  Platelets 150 - 400 K/uL 169 177     Discharge Physical Exam:   General: alert, cooperative and fatigued Mood/Affect: Appropriate/Tired Chest: clear to auscultation, no wheezes, rales or rhonchi, symmetric air entry.  CVS exam: normal rate and regular rhythm. Breast exam: not examined. Abdomen:  + bowel sounds, soft, NT Uterine Fundus: firm, U/-3 Lochia: appropriate Laceration: Healing Well DVT Evaluation: Negative Homan's sign. No cords or calf tenderness. No significant calf/ankle edema. Skin exam: Warm, Dry.  Procedures &/or Complications: Intrapartum Procedures: spontaneous vaginal delivery Postpartum Procedures: none Complications-Operative and Postpartum: vaginal and periurethral, labial, and periclitoral laceration   Discharge Information:  Diagnoses: Term Pregnancy-delivered Activity:  pelvic rest Diet:   routine Medications: PNV and Ibuprofen Condition: stable Instructions:  Pain Management, Peri-Care, Breastfeeding, Who and When to call for postpartum complications. Information Sheet(s) given PPD&BB, Iron Rich Diet  Discharge to: home  Follow-up Information    Follow up with [facility]. Schedule an appointment as soon as possible for a visit in 6 weeks.   Specialty:  Obstetrics and Gynecology   Why:  Please call if you have any questions or concerns, prior to your next visit.   Contact information:   [address] [place] [id number] [phone]       Marlene Bast MSN, CNM 06/03/2015 8:50 AM

## 2015-06-03 NOTE — Lactation Note (Signed)
This note was copied from the chart of Beth Meadows. Lactation Consultation Note  Patient Name: Beth Meadows [id number] Date: 06/03/2015 Reason for consult: Follow-up assessment;Other (Comment) (4% weight loss )  Baby is 38 hours old at 4% weight loss,breast feeding Range - 20 -60 mins , 3 Voids , 2 stools. Per mom the baby has voided more than 3 ( actually 5 or 6 times) . Latch scores 8-10.  Bili check at 30 hours - 7.2, @ 36 1/2 hours Serum Bili - 8.4. Mom O+ , Baby A+. Mom denies soreness and comfortable when the Baby is feeding.  LC discussed sore nipple and engorgement prevention and tx.  LC instructed mom on the use hand pump and checked flange,  Mom returned demo.Mother informed of post-discharge support and given phone number to the lactation  department, including services for phone call assistance; out-patient appointments; and breastfeeding support  group. List of other breastfeeding resources in the community given in the handout. Encouraged mother to call for  problems or concerns related to breastfeeding. Mom aware the Baby needs to feed. , encouraged skin to skin.     Maternal Data    Feeding Feeding Type:  (per mom plans to feed soon, baby hasn't fed since 0500 )  LATCH Score/Interventions                Intervention(s): Breastfeeding basics reviewed     Lactation Tools Discussed/Used Tools: Pump Breast pump type: Manual (LC checked flange , #24 good fit ) WIC Program: No (mom active with Medicaid , suggested to call [facility] ) Pump Review: Setup, frequency, and cleaning;Milk Storage Initiated by:: MAI  Date initiated:: 06/03/15   Consult Status Consult Status: Complete Date: 06/03/15    Kathrin Greathouse 06/03/2015, 10:25 AM

## 2015-06-03 NOTE — Discharge Instructions (Signed)
Iron-Rich Diet °An iron-rich diet contains foods that are good sources of iron. Iron is an important mineral that helps your body produce hemoglobin. Hemoglobin is a protein in red blood cells that carries oxygen to the body's tissues. Sometimes, the iron level in your blood can be low. This may be caused by: °· A lack of iron in your diet. °· Blood loss. °· Times of growth, such as during pregnancy or during a child's growth and development. °Low levels of iron can cause a decrease in the number of red blood cells. This can result in iron deficiency anemia. Iron deficiency anemia symptoms include: °· Tiredness. °· Weakness. °· Irritability. °· Increased chance of infection. °Here are some recommendations for daily iron intake: °· Males older than 23 years of age need 8 mg of iron per day. °· Women ages 19 to 50 need 18 mg of iron per day. °· Pregnant women need 27 mg of iron per day, and women who are over 19 years of age and breastfeeding need 9 mg of iron per day. °· Women over the age of 50 need 8 mg of iron per day. °SOURCES OF IRON °There are 2 types of iron that are found in food: heme iron and nonheme iron. Heme iron is absorbed by the body better than nonheme iron. Heme iron is found in meat, poultry, and fish. Nonheme iron is found in grains, beans, and vegetables. °Heme Iron Sources °Food / Iron (mg) °· Chicken liver, 3 oz (85 g)/ 10 mg °· Beef liver, 3 oz (85 g)/ 5.5 mg °· Oysters, 3 oz (85 g)/ 8 mg °· Beef, 3 oz (85 g)/ 2 to 3 mg °· Shrimp, 3 oz (85 g)/ 2.8 mg °· Turkey, 3 oz (85 g)/ 2 mg °· Chicken, 3 oz (85 g) / 1 mg °· Fish (tuna, halibut), 3 oz (85 g)/ 1 mg °· Pork, 3 oz (85 g)/ 0.9 mg °Nonheme Iron Sources °Food / Iron (mg) °· Ready-to-eat breakfast cereal, iron-fortified / 3.9 to 7 mg °· Tofu, ½ cup / 3.4 mg °· Kidney beans, ½ cup / 2.6 mg °· Baked potato with skin / 2.7 mg °· Asparagus, ½ cup / 2.2 mg °· Avocado / 2 mg °· Dried peaches, ½ cup / 1.6 mg °· Raisins, ½ cup / 1.5 mg °· Soy milk, 1 cup  / 1.5 mg °· Whole-wheat bread, 1 slice / 1.2 mg °· Spinach, 1 cup / 0.8 mg °· Broccoli, ½ cup / 0.6 mg °IRON ABSORPTION °Certain foods can decrease the body's absorption of iron. Try to avoid these foods and beverages while eating meals with iron-containing foods: °· Coffee. °· Tea. °· Fiber. °· Soy. °Foods containing vitamin C can help increase the amount of iron your body absorbs from iron sources, especially from nonheme sources. Eat foods with vitamin C along with iron-containing foods to increase your iron absorption. Foods that are high in vitamin C include many fruits and vegetables. Some good sources are: °· Fresh orange juice. °· Oranges. °· Strawberries. °· Mangoes. °· Grapefruit. °· Red bell peppers. °· Green bell peppers. °· Broccoli. °· Potatoes with skin. °· Tomato juice. °Document Released: 05/08/2005 Document Revised: 12/17/2011 Document Reviewed: 03/15/2011 °ExitCare® Patient Information ©2015 [employer]. This information is not intended to replace advice given to you by your health care provider. Make sure you discuss any questions you have with your health care provider. °Postpartum Depression and Baby Blues °The postpartum period begins right after the birth of a baby.   During this time, there is often a great amount of joy and excitement. It is also a time of many changes in the life of the parents. Regardless of how many times a mother gives birth, each child brings new challenges and dynamics to the family. It is not unusual to have feelings of excitement along with confusing shifts in moods, emotions, and thoughts. All mothers are at risk of developing postpartum depression or the "baby blues." These mood changes can occur right after giving birth, or they may occur many months after giving birth. The baby blues or postpartum depression can be mild or severe. Additionally, postpartum depression can go away rather quickly, or it can be a long-term condition.  °CAUSES °Raised hormone levels  and the rapid drop in those levels are thought to be a main cause of postpartum depression and the baby blues. A number of hormones change during and after pregnancy. Estrogen and progesterone usually decrease right after the delivery of your baby. The levels of thyroid hormone and various cortisol steroids also rapidly drop. Other factors that play a role in these mood changes include major life events and genetics.  °RISK FACTORS °If you have any of the following risks for the baby blues or postpartum depression, know what symptoms to watch out for during the postpartum period. Risk factors that may increase the likelihood of getting the baby blues or postpartum depression include: °· Having a personal or family history of depression.   °· Having depression while being pregnant.   °· Having premenstrual mood issues or mood issues related to oral contraceptives. °· Having a lot of life stress.   °· Having marital conflict.   °· Lacking a social support network.   °· Having a baby with special needs.   °· Having health problems, such as diabetes.   °SIGNS AND SYMPTOMS °Symptoms of baby blues include: °· Brief changes in mood, such as going from extreme happiness to sadness. °· Decreased concentration.   °· Difficulty sleeping.   °· Crying spells, tearfulness.   °· Irritability.   °· Anxiety.   °Symptoms of postpartum depression typically begin within the first month after giving birth. These symptoms include: °· Difficulty sleeping or excessive sleepiness.   °· Marked weight loss.   °· Agitation.   °· Feelings of worthlessness.   °· Lack of interest in activity or food.   °Postpartum psychosis is a very serious condition and can be dangerous. Fortunately, it is rare. Displaying any of the following symptoms is cause for immediate medical attention. Symptoms of postpartum psychosis include:  °· Hallucinations and delusions.   °· Bizarre or disorganized behavior.   °· Confusion or disorientation.   °DIAGNOSIS  °A  diagnosis is made by an evaluation of your symptoms. There are no medical or lab tests that lead to a diagnosis, but there are various questionnaires that a health care provider may use to identify those with the baby blues, postpartum depression, or psychosis. Often, a screening tool called the Edinburgh Postnatal Depression Scale is used to diagnose depression in the postpartum period.  °TREATMENT °The baby blues usually goes away on its own in 1-2 weeks. Social support is often all that is needed. You will be encouraged to get adequate sleep and rest. Occasionally, you may be given medicines to help you sleep.  °Postpartum depression requires treatment because it can last several months or longer if it is not treated. Treatment may include individual or group therapy, medicine, or both to address any social, physiological, and psychological factors that may play a role in the depression. Regular exercise, a healthy diet, rest,   and social support may also be strongly recommended.  °Postpartum psychosis is more serious and needs treatment right away. Hospitalization is often needed. °HOME CARE INSTRUCTIONS °· Get as much rest as you can. Nap when the baby sleeps.   °· Exercise regularly. Some women find yoga and walking to be beneficial.   °· Eat a balanced and nourishing diet.   °· Do little things that you enjoy. Have a cup of tea, take a bubble bath, read your favorite magazine, or listen to your favorite music. °· Avoid alcohol.   °· Ask for help with household chores, cooking, grocery shopping, or running errands as needed. Do not try to do everything.   °· Talk to people close to you about how you are feeling. Get support from your partner, family members, friends, or other new moms. °· Try to stay positive in how you think. Think about the things you are grateful for.   °· Do not spend a lot of time alone.   °· Only take over-the-counter or prescription medicine as directed by your health care  provider. °· Keep all your postpartum appointments.   °· Let your health care provider know if you have any concerns.   °SEEK MEDICAL CARE IF: °You are having a reaction to or problems with your medicine. °SEEK IMMEDIATE MEDICAL CARE IF: °· You have suicidal feelings.   °· You think you may harm the baby or someone else. °MAKE SURE YOU: °· Understand these instructions. °· Will watch your condition. °· Will get help right away if you are not doing well or get worse. °Document Released: 06/28/2004 Document Revised: 09/29/2013 Document Reviewed: 07/06/2013 °ExitCare® Patient Information ©2015 [employer]. This information is not intended to replace advice given to you by your health care provider. Make sure you discuss any questions you have with your health care provider. °Etonogestrel implant °What is this medicine? °ETONOGESTREL (et oh noe JES trel) is a contraceptive (birth control) device. It is used to prevent pregnancy. It can be used for up to 3 years. °This medicine may be used for other purposes; ask your health care provider or pharmacist if you have questions. °COMMON BRAND NAME(S): Implanon, Nexplanon °What should I tell my health care provider before I take this medicine? °They need to know if you have any of these conditions: °-abnormal vaginal bleeding °-blood vessel disease or blood clots °-cancer of the breast, cervix, or liver °-depression °-diabetes °-gallbladder disease °-headaches °-heart disease or recent heart attack °-high blood pressure °-high cholesterol °-kidney disease °-liver disease °-renal disease °-seizures °-tobacco smoker °-an unusual or allergic reaction to etonogestrel, other hormones, anesthetics or antiseptics, medicines, foods, dyes, or preservatives °-pregnant or trying to get pregnant °-breast-feeding °How should I use this medicine? °This device is inserted just under the skin on the inner side of your upper arm by a health care professional. °Talk to your pediatrician  regarding the use of this medicine in children. Special care may be needed. °Overdosage: If you think you've taken too much of this medicine contact a poison control center or emergency room at once. °Overdosage: If you think you have taken too much of this medicine contact a poison control center or emergency room at once. °NOTE: This medicine is only for you. Do not share this medicine with others. °What if I miss a dose? °This does not apply. °What may interact with this medicine? °Do not take this medicine with any of the following medications: °-amprenavir °-bosentan °-fosamprenavir °This medicine may also interact with the following medications: °-barbiturate medicines for inducing sleep   or treating seizures °-certain medicines for fungal infections like ketoconazole and itraconazole °-griseofulvin °-medicines to treat seizures like carbamazepine, felbamate, oxcarbazepine, phenytoin, topiramate °-modafinil °-phenylbutazone °-rifampin °-some medicines to treat HIV infection like atazanavir, indinavir, lopinavir, nelfinavir, tipranavir, ritonavir °-St. John's wort °This list may not describe all possible interactions. Give your health care provider a list of all the medicines, herbs, non-prescription drugs, or dietary supplements you use. Also tell them if you smoke, drink alcohol, or use illegal drugs. Some items may interact with your medicine. °What should I watch for while using this medicine? °This product does not protect you against HIV infection (AIDS) or other sexually transmitted diseases. °You should be able to feel the implant by pressing your fingertips over the skin where it was inserted. Tell your doctor if you cannot feel the implant. °What side effects may I notice from receiving this medicine? °Side effects that you should report to your doctor or health care professional as soon as possible: °-allergic reactions like skin rash, itching or hives, swelling of the face, lips, or tongue °-breast  lumps °-changes in vision °-confusion, trouble speaking or understanding °-dark urine °-depressed mood °-general ill feeling or flu-like symptoms °-light-colored stools °-loss of appetite, nausea °-right upper belly pain °-severe headaches °-severe pain, swelling, or tenderness in the abdomen °-shortness of breath, chest pain, swelling in a leg °-signs of pregnancy °-sudden numbness or weakness of the face, arm or leg °-trouble walking, dizziness, loss of balance or coordination °-unusual vaginal bleeding, discharge °-unusually weak or tired °-yellowing of the eyes or skin °Side effects that usually do not require medical attention (Report these to your doctor or health care professional if they continue or are bothersome.): °-acne °-breast pain °-changes in weight °-cough °-fever or chills °-headache °-irregular menstrual bleeding °-itching, burning, and vaginal discharge °-pain or difficulty passing urine °-sore throat °This list may not describe all possible side effects. Call your doctor for medical advice about side effects. You may report side effects to FDA at 1-800-FDA-1088. °Where should I keep my medicine? °This drug is given in a hospital or clinic and will not be stored at home. °NOTE: This sheet is a summary. It may not cover all possible information. If you have questions about this medicine, talk to your doctor, pharmacist, or health care provider. °© 2015, Elsevier/Gold Standard. (2012-03-31 15:37:45) ° °

## 2015-07-27 NOTE — Addendum Note (Signed)
Addendum  created 07/27/15 2259 by Cristela BlueKyle Camp Gopal, MD   Modules edited: Anesthesia Responsible Staff

## 2016-05-24 IMAGING — US US OB COMP LESS 14 WK
1 series · 14 of 28 positions shown · non-contrast
Comparison: None.

CLINICAL DATA: Pregnant, right lower quadrant pain

EXAM:
OBSTETRIC <14 WK US AND TRANSVAGINAL OB US
TECHNIQUE: Both transabdominal and transvaginal ultrasound examinations were
performed for complete evaluation of the gestation as well as the
maternal uterus, adnexal regions, and pelvic cul-de-sac.
Transvaginal technique was performed to assess early pregnancy.

[Series 1: us ob comp less 14 wks · 14 of 43 slices shown]
[im 2/43]
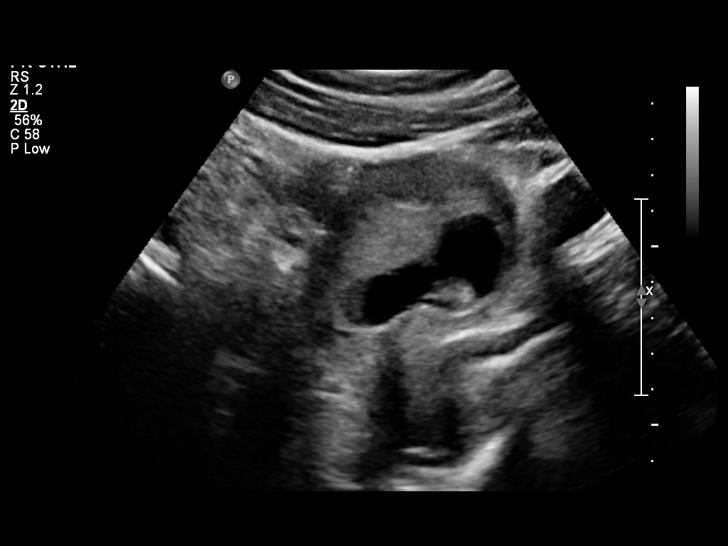
[im 5/43]
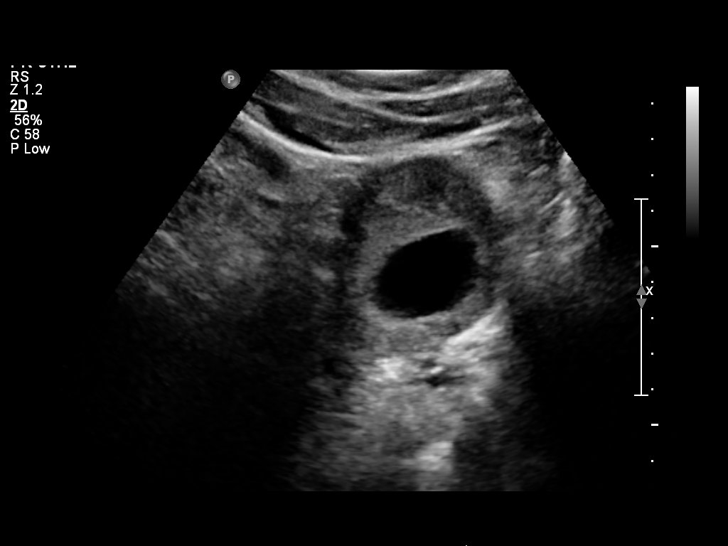
[im 8/43]
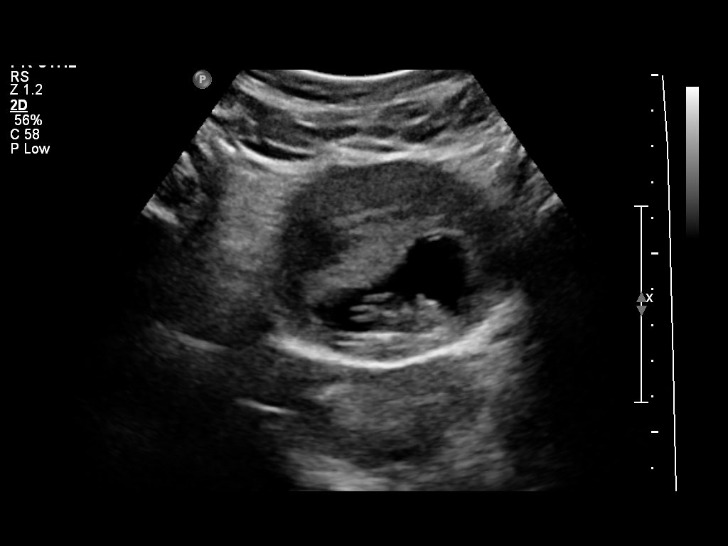
[im 11/43]
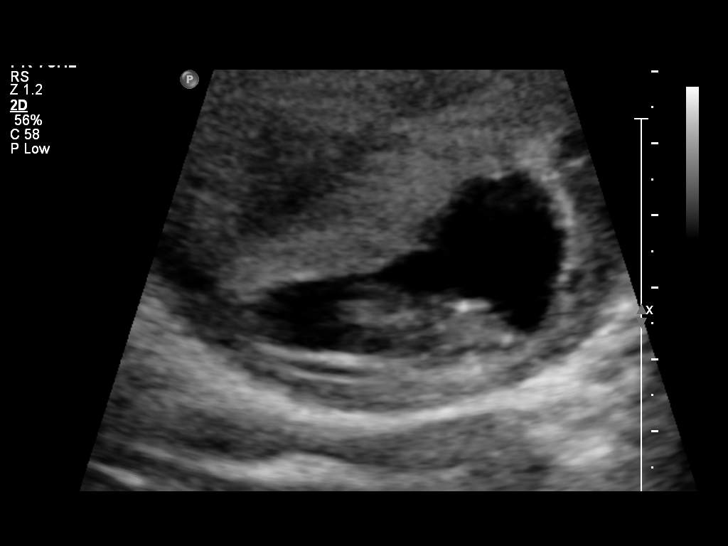
[im 15/43]
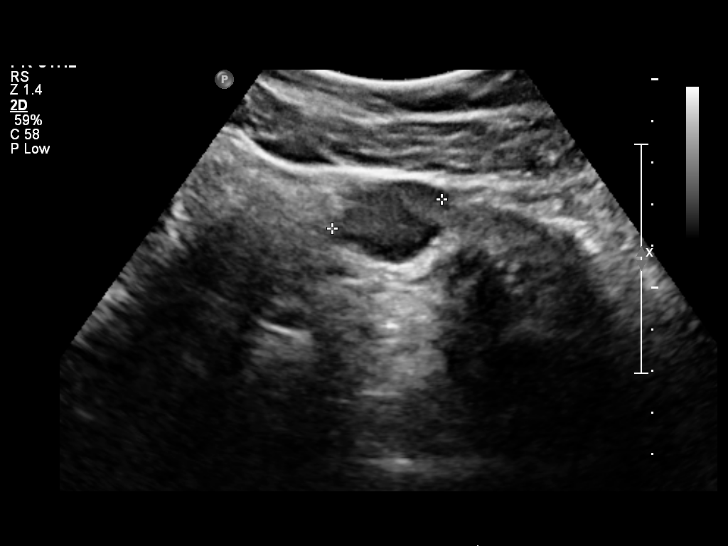
[im 18/43]
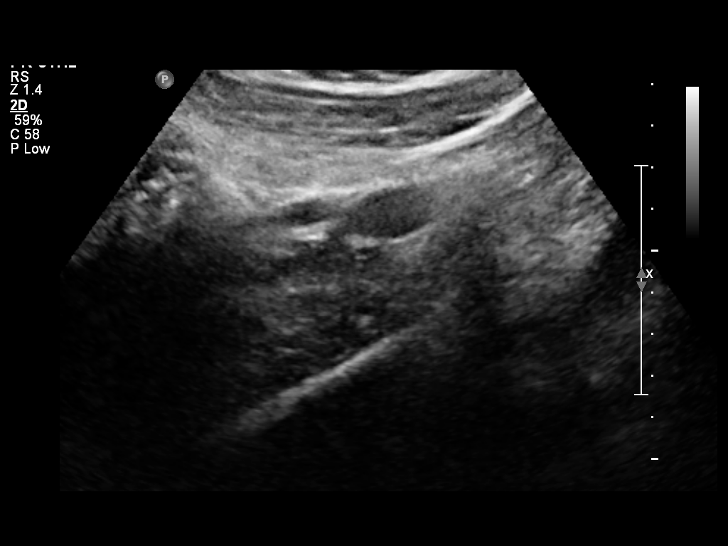
[im 21/43]
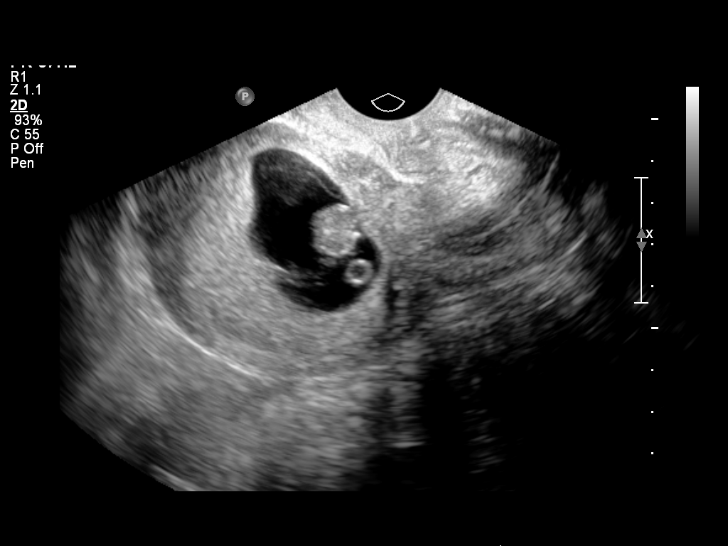
[im 24/43]
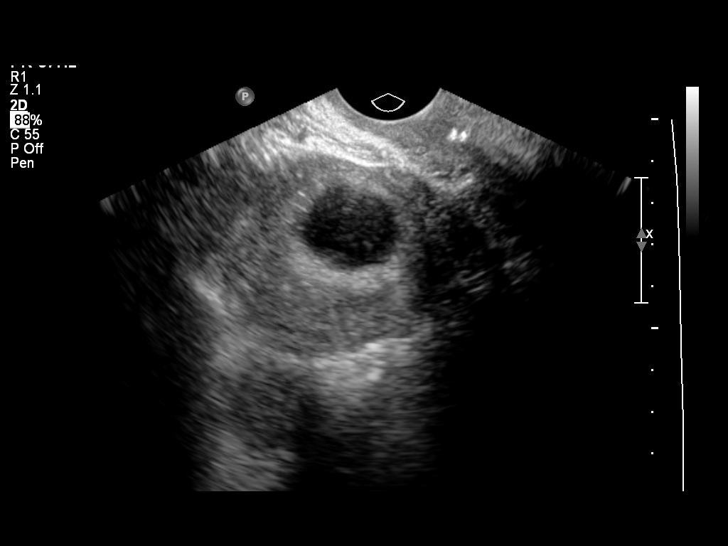
[im 27/43]
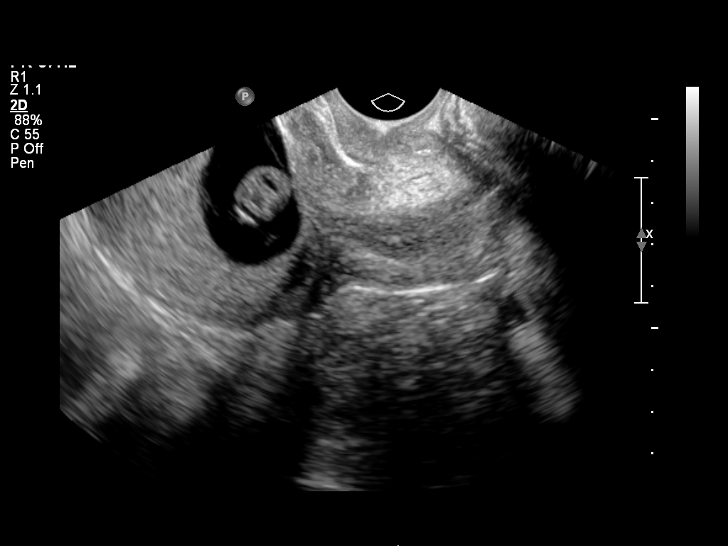
[im 30/43]
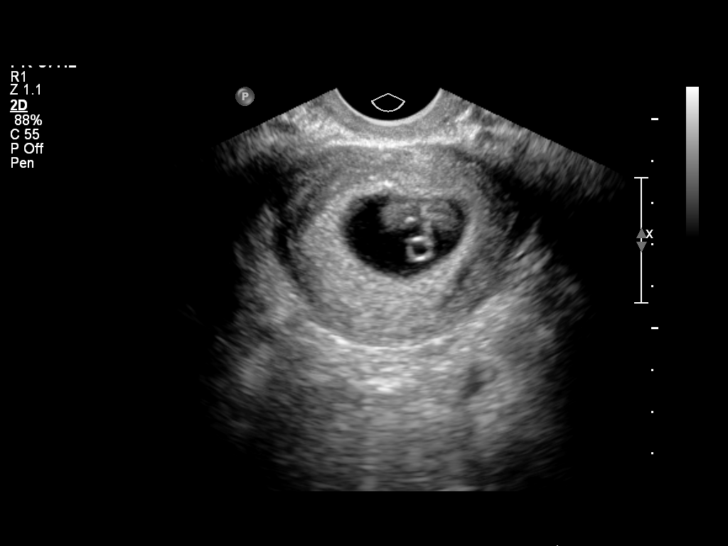
[im 33/43]
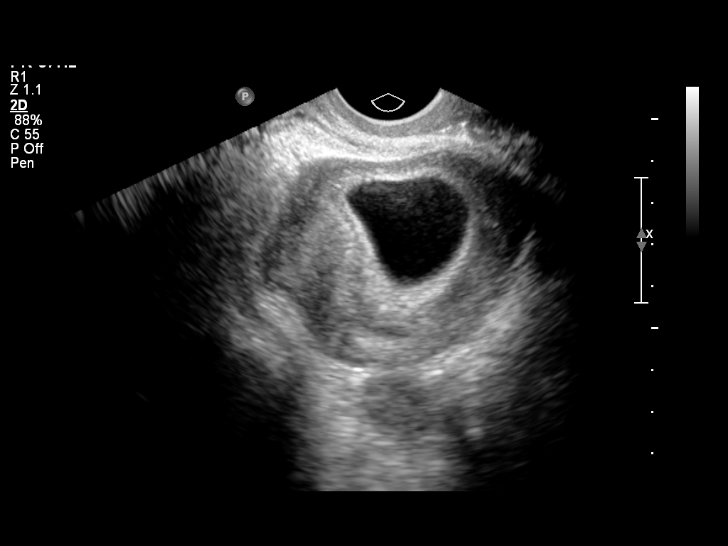
[im 36/43]
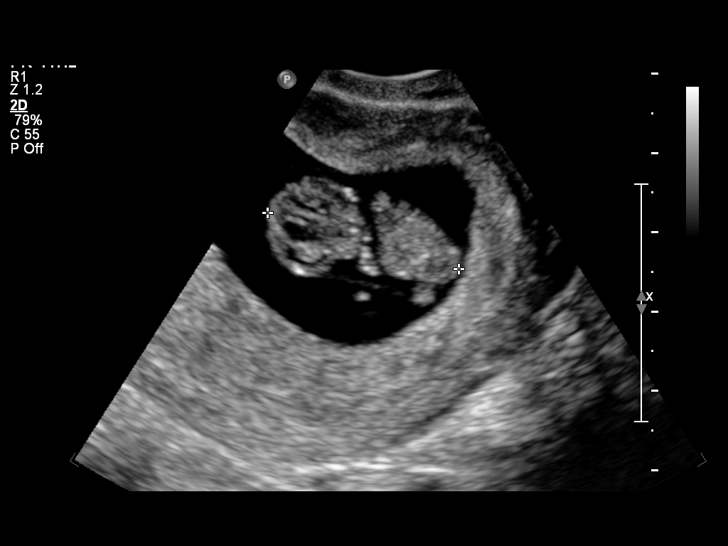
[im 39/43]
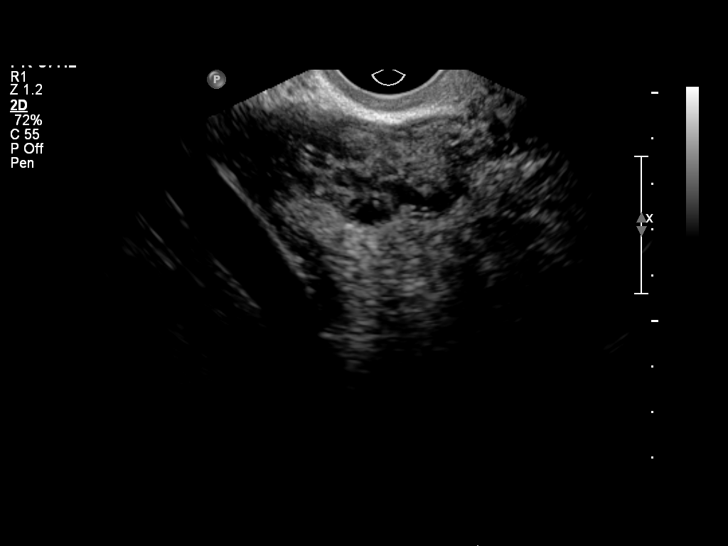
[im 43/43]
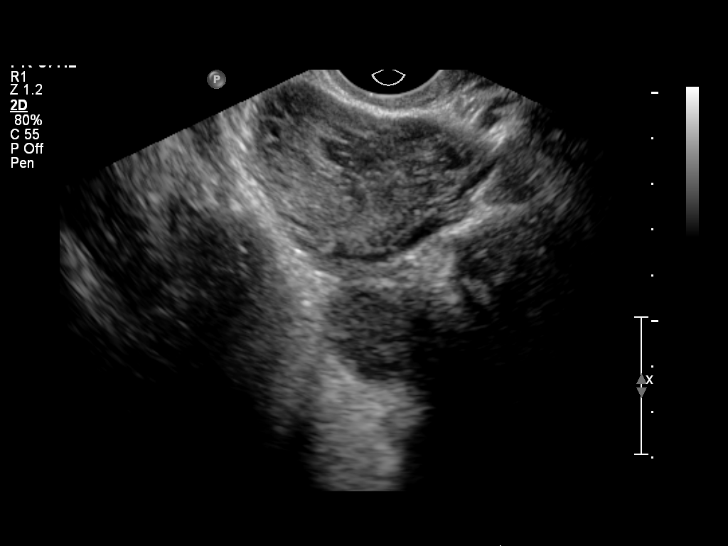

[14 of 28 positions shown; findings below may reference images not displayed]

FINDINGS: Intrauterine gestational sac: Visualized/normal in shape.

Yolk sac:  Present

Embryo:  Present

Cardiac Activity: Present

Heart Rate:  172 bpm

CRL:   25.2  mm   9 w 2 d                  US EDC: 06/04/2015

Maternal uterus/adnexae: No subchorionic hemorrhage.

Left ovary is within normal limits. Right ovary is not discretely
visualized.

No free fluid.
IMPRESSION: Single live intrauterine gestation with estimated gestational age 9
weeks 2 days by crown-rump length.

## 2019-06-02 ENCOUNTER — Other Ambulatory Visit: Payer: Self-pay

## 2019-06-02 DIAGNOSIS — Z20822 Contact with and (suspected) exposure to covid-19: Secondary | ICD-10-CM

## 2019-06-04 LAB — NOVEL CORONAVIRUS, NAA: SARS-CoV-2, NAA: NOT DETECTED

## 2019-09-08 ENCOUNTER — Other Ambulatory Visit: Payer: Self-pay

## 2019-09-08 DIAGNOSIS — Z20822 Contact with and (suspected) exposure to covid-19: Secondary | ICD-10-CM

## 2019-09-10 LAB — NOVEL CORONAVIRUS, NAA: SARS-CoV-2, NAA: NOT DETECTED

## 2019-12-25 ENCOUNTER — Ambulatory Visit: Payer: Self-pay

## 2020-01-19 ENCOUNTER — Ambulatory Visit: Payer: Self-pay

## 2020-10-08 NOTE — L&D Delivery Note (Signed)
OB/GYN Faculty Practice Delivery Note  Beth Meadows is a 30 y.o. G3P1011 s/p precipitous vaginal delivery at [redacted]w[redacted]d. She was admitted for SOL/SROM.   ROM: 0h 2m with clear fluid GBS Status:  Negative/-- (07/08 1131) Maximum Maternal Temperature: 16F  Labor Progress: Initial SVE: 3/90/-3. She then progressed to complete.   Delivery Date/Time: 05/09/2021 at 17:17 Delivery: Called to room and as patient was being transferred from her MAU bed, patient was complete and pushing involuntarily with crowning of head. Head delivered OA. Loose nuchal cord x1 present, easily reduced at perineum. Maternal dystocia occurred briefly for about 30 seconds with head delivered. Shoulder and body delivered in usual fashion. Infant with spontaneous cry, placed on mother's abdomen, dried and stimulated. Cord clamped x 2 after 1-minute delay, and cut by FOB. Cord blood drawn. Placenta delivered spontaneously with gentle cord traction. Fundus firm with massage and Pitocin. Labia, perineum, vagina, and cervix inspected inspected with no lacerations.  Baby Weight: pending  Placenta: Sent to L&D Complications: None Lacerations: None EBL: 150 mL Analgesia: NONE  Infant:  APGAR (1 MIN):  8 APGAR (5 MINS):  9 APGAR (10 MINS):     Jen Mow, DO [facility] for [employer], [facility] 05/09/2021, 6:05 PM

## 2020-10-20 LAB — PREGNANCY, URINE: Preg Test, Ur: POSITIVE

## 2020-10-22 LAB — HEMOGLOBIN A1C: Hemoglobin A1C: 5.2

## 2020-10-24 LAB — OB RESULTS CONSOLE HGB/HCT, BLOOD
HCT: 39 (ref 29–41)
Hemoglobin: 13.7

## 2020-10-24 LAB — OB RESULTS CONSOLE HEPATITIS B SURFACE ANTIGEN: Hepatitis B Surface Ag: NEGATIVE

## 2020-10-24 LAB — OB RESULTS CONSOLE PLATELET COUNT: Platelets: 291

## 2020-10-24 LAB — OB RESULTS CONSOLE GC/CHLAMYDIA
Chlamydia: NEGATIVE
Gonorrhea: NEGATIVE

## 2020-10-24 LAB — OB RESULTS CONSOLE RUBELLA ANTIBODY, IGM: Rubella: IMMUNE

## 2020-10-24 LAB — OB RESULTS CONSOLE RPR: RPR: NONREACTIVE

## 2020-10-24 LAB — SICKLE CELL SCREEN: Sickle Cell Screen: NEGATIVE

## 2020-10-24 LAB — OB RESULTS CONSOLE ANTIBODY SCREEN: Antibody Screen: NEGATIVE

## 2020-10-24 LAB — OB RESULTS CONSOLE HIV ANTIBODY (ROUTINE TESTING): HIV: NONREACTIVE

## 2020-10-24 LAB — OB RESULTS CONSOLE ABO/RH: RH Type: NEGATIVE

## 2021-02-20 ENCOUNTER — Other Ambulatory Visit: Payer: Self-pay | Admitting: *Deleted

## 2021-02-20 ENCOUNTER — Encounter: Payer: Self-pay | Admitting: *Deleted

## 2021-02-23 ENCOUNTER — Other Ambulatory Visit: Payer: Self-pay

## 2021-02-23 ENCOUNTER — Other Ambulatory Visit (INDEPENDENT_AMBULATORY_CARE_PROVIDER_SITE_OTHER): Payer: BC Managed Care – PPO | Admitting: *Deleted

## 2021-02-23 DIAGNOSIS — Z3480 Encounter for supervision of other normal pregnancy, unspecified trimester: Secondary | ICD-10-CM

## 2021-02-23 NOTE — Progress Notes (Signed)
   Patient in clinic for 28 week labs only. BP monitor given today. Patient has weight scale at home.  Clovis Pu, RN

## 2021-02-24 LAB — CBC
Hematocrit: 33.3 % — ABNORMAL LOW (ref 34.0–46.6)
Hemoglobin: 11.3 g/dL (ref 11.1–15.9)
MCH: 30.5 pg (ref 26.6–33.0)
MCHC: 33.9 g/dL (ref 31.5–35.7)
MCV: 90 fL (ref 79–97)
Platelets: 222 10*3/uL (ref 150–450)
RBC: 3.7 x10E6/uL — ABNORMAL LOW (ref 3.77–5.28)
RDW: 13.1 % (ref 11.7–15.4)
WBC: 9.6 10*3/uL (ref 3.4–10.8)

## 2021-02-24 LAB — GLUCOSE TOLERANCE, 2 HOURS W/ 1HR
Glucose, 1 hour: 125 mg/dL (ref 65–179)
Glucose, 2 hour: 87 mg/dL (ref 65–152)
Glucose, Fasting: 88 mg/dL (ref 65–91)

## 2021-02-24 LAB — HIV ANTIBODY (ROUTINE TESTING W REFLEX): HIV Screen 4th Generation wRfx: NONREACTIVE

## 2021-02-24 LAB — RPR: RPR Ser Ql: NONREACTIVE

## 2021-02-24 LAB — HEPATITIS C ANTIBODY: Hep C Virus Ab: <0.1 s/co ratio (ref 0.0–0.9)

## 2021-03-01 ENCOUNTER — Encounter: Payer: Self-pay | Admitting: Obstetrics and Gynecology

## 2021-03-01 ENCOUNTER — Other Ambulatory Visit: Payer: Self-pay

## 2021-03-01 ENCOUNTER — Ambulatory Visit (INDEPENDENT_AMBULATORY_CARE_PROVIDER_SITE_OTHER): Payer: BC Managed Care – PPO | Admitting: Obstetrics and Gynecology

## 2021-03-01 VITALS — BP 119/71 | HR 112 | Temp 97.5°F | Ht 65.0 in | Wt 197.2 lb

## 2021-03-01 DIAGNOSIS — Z3A29 29 weeks gestation of pregnancy: Secondary | ICD-10-CM

## 2021-03-01 DIAGNOSIS — Z3480 Encounter for supervision of other normal pregnancy, unspecified trimester: Secondary | ICD-10-CM

## 2021-03-01 LAB — OB RESULTS CONSOLE ABO/RH: RH Type: POSITIVE

## 2021-03-01 NOTE — Progress Notes (Signed)
INITIAL OBSTETRICAL VISIT Patient name: Beth Meadows MRN [id number]  Date of birth: June 08, 1991 Chief Complaint:   Initial Prenatal Visit  History of Present Illness:   Beth Meadows is a 30 y.o. G60P1011 African American female at [redacted]w[redacted]d by LMP with an Estimated Date of Delivery: 05/12/21 being seen today for her initial obstetrical visit.  Her obstetrical history is significant for 1st pregnancy: SVD @ 39.1 wks, female weighing 7 lbs 2 oz. This is a planned pregnancy. She and the father of the baby (FOB)/spouse "Beth Meadows" live together. She has a support system that consists of spouse/family/friends. Today she reports no complaints.   Patient's last menstrual period was 08/05/2020. Last pap 07/2018. Results were: normal Review of Systems:   Pertinent items are noted in HPI Denies cramping/contractions, leakage of fluid, vaginal bleeding, abnormal vaginal discharge w/ itching/odor/irritation, headaches, visual changes, shortness of breath, chest pain, abdominal pain, severe nausea/vomiting, or problems with urination or bowel movements unless otherwise stated above.  Pertinent History Reviewed:  Reviewed past medical,surgical, social, obstetrical and family history.  Reviewed problem list, medications and allergies. OB History  Gravida Para Term Preterm AB Living  3 1 1   1 1   SAB IAB Ectopic Multiple Live Births  1     0 1    # Outcome Date GA Lbr Len/2nd Weight Sex Delivery Anes PTL Lv  3 Current           2 Term 06/01/15 [redacted]w[redacted]d 17:10 / 00:27 7 lb 2.2 oz (3.238 kg) F Vag-Spont EPI  LIV  1 SAB              Birth Comments: Age 25 years old miscarriage   Physical Assessment:   Vitals:   02/20/21 1111 03/01/21 0811  BP:  119/71  Pulse:  (!) 112  Temp:  (!) 97.5 F (36.4 C)  Weight:  197 lb 3.2 oz (89.4 kg)  Height: 5\' 5"  (1.651 m)   Body mass index is 32.82 kg/m.       Physical Examination:  General appearance - well appearing, and in no distress  Mental status - alert,  oriented to person, place, and time  Psych:  She has a normal mood and affect  Skin - warm and dry, normal color, no suspicious lesions noted  Chest - effort normal, all lung fields clear to auscultation bilaterally  Heart - normal rate and regular rhythm  Abdomen - soft, nontender  Extremities:  No swelling or varicosities noted  Pelvic - VULVA: normal appearing vulva with no masses, tenderness or lesions  VAGINA: normal appearing vagina with normal color and discharge, no lesions.   CERVIX: normal appearing cervix without discharge or lesions, no CMT  Thin prep pap is not done   FHTs by doppler: 147 bpm  Assessment & Plan:  1) Low-Risk Pregnancy G3P1011 at [redacted]w[redacted]d with an Estimated Date of Delivery: 05/12/21   2) Initial OB visit - Welcomed to practice and introduced self to patient in addition to discussing other advanced practice providers that she may be seeing at this practice - Congratulated patient - Anticipatory guidance on upcoming appointments - Educated on COVID19 and pregnancy and the integration of virtual appointments  - Educated on babyscripts app- patient reports she has not received email, encouraged to look in spam folder and to call office if she still has not received email - patient verbalizes understanding    3) Supervision of other normal pregnancy, antepartum - Patient desires a waterbirth - class scheduled for  03/28/2021 - Explained waterbirth policy of [facility], CNM visit, consent to sign, and certificate to be on-file  4) [redacted] weeks gestation of pregnancy     Meds: No orders of the defined types were placed in this encounter.   Initial labs obtained Continue prenatal vitamins Reviewed n/v relief measures and warning s/s to report Reviewed recommended weight gain based on pre-gravid BMI Encouraged well-balanced diet Genetic Screening discussed: declined Cystic fibrosis, SMA, Fragile X screening discussed declined The nature of [facility]  Faculty Practice with multiple MDs and other Advanced Practice Providers was explained to patient; also emphasized that residents, students are part of our team.  Discussed optimized OB schedule and video visits. Advised can have an in-office visit whenever she feels she needs to be seen.  Does have own BP cuff. BP cuff given to her at last appt. Check BP weekly, let us know if >140/90. Advised to call during normal business hours and there is an after-hours nurse line available.    Follow-up: Return in about 3 weeks (around 03/22/2021) for Return OB - My Chart video.   Orders Placed This Encounter  Procedures  . OB RESULTS CONSOLE GC/Chlamydia  . OB RESULTS CONSOLE RPR  . OB RESULTS CONSOLE HIV antibody  . OB RESULTS CONSOLE Rubella Antibody  . OB RESULTS CONSOLE Hepatitis B surface antigen  . OB RESULTS CONSOLE Hemoglobin and hematocrit, blood  . OB RESULTS CONSOLE PLATELET COUNT  . Sickle cell screen  . Pregnancy, urine  . Hemoglobin A1c  . OB RESULTS CONSOLE ABO/Rh  . OB RESULTS CONSOLE Antibody Screen  . OB RESULTS CONSOLE ABO/Rh    Raelyn Mora MSN, CNM 03/01/2021

## 2021-03-13 ENCOUNTER — Other Ambulatory Visit: Payer: Self-pay | Admitting: Obstetrics and Gynecology

## 2021-03-13 ENCOUNTER — Other Ambulatory Visit: Payer: Self-pay

## 2021-03-13 ENCOUNTER — Encounter (HOSPITAL_COMMUNITY): Payer: Self-pay | Admitting: Obstetrics and Gynecology

## 2021-03-13 ENCOUNTER — Inpatient Hospital Stay (HOSPITAL_COMMUNITY)
Admission: AD | Admit: 2021-03-13 | Discharge: 2021-03-13 | Disposition: A | Discharge status: Home / Self Care | Payer: BC Managed Care – PPO | Attending: Obstetrics and Gynecology | Admitting: Obstetrics and Gynecology

## 2021-03-13 DIAGNOSIS — Z3A31 31 weeks gestation of pregnancy: Secondary | ICD-10-CM | POA: Diagnosis not present

## 2021-03-13 DIAGNOSIS — N898 Other specified noninflammatory disorders of vagina: Secondary | ICD-10-CM | POA: Diagnosis present

## 2021-03-13 DIAGNOSIS — O99891 Other specified diseases and conditions complicating pregnancy: Secondary | ICD-10-CM

## 2021-03-13 DIAGNOSIS — O26893 Other specified pregnancy related conditions, third trimester: Secondary | ICD-10-CM | POA: Diagnosis not present

## 2021-03-13 DIAGNOSIS — Z3480 Encounter for supervision of other normal pregnancy, unspecified trimester: Secondary | ICD-10-CM

## 2021-03-13 LAB — WET PREP, GENITAL
Trich, Wet Prep: NONE SEEN
Yeast Wet Prep HPF POC: NONE SEEN

## 2021-03-13 LAB — URINALYSIS, ROUTINE W REFLEX MICROSCOPIC
Bilirubin Urine: NEGATIVE
Glucose, UA: NEGATIVE mg/dL
Hgb urine dipstick: NEGATIVE
Ketones, ur: NEGATIVE mg/dL
Leukocytes,Ua: NEGATIVE
Nitrite: NEGATIVE
Protein, ur: NEGATIVE mg/dL
Specific Gravity, Urine: 1.018 (ref 1.005–1.030)
pH: 6 (ref 5.0–8.0)

## 2021-03-13 MED ORDER — METRONIDAZOLE 500 MG PO TABS: 500.0000 mg | ORAL_TABLET | Freq: Two times a day (BID) | ORAL | 0 refills | Status: AC

## 2021-03-13 NOTE — MAU Provider Note (Signed)
History     CSN: [id number]  Arrival date and time: 03/13/21 1239   None     Chief Complaint  Patient presents with  . Rupture of Membranes   HPI  Patient is a g3p1011 at [redacted]w[redacted]d who presents with concerns that her water broke. Reports that she had intercourse yesterday morning and then noted leaked of fluid around 1015 AM. Reports continued leakage of fluid. Also reports a single episode of spoting on Saturday night that resolved. Has been having some intermittent cramping. Denies vaginal bleedings, dysuria. Denies issues with bowel movements or voiding. Has been wearing a pad since yesterday, has replaced it a few times. Denies any abnormal vaginal disharge otherwise. No large gushes of fluid. Endorses good fetal movement.    OB History    Gravida  3   Para  1   Term  1   Preterm      AB  1   Living  1     SAB  1   IAB      Ectopic      Multiple  0   Live Births  1           Past Medical History:  Diagnosis Date  . Frequent nosebleeds     Past Surgical History:  Procedure Laterality Date  . WISDOM TOOTH EXTRACTION      Family History  Problem Relation Age of Onset  . Anemia Mother   . Diabetes Mother   . Depression Mother   . Asthma Sister   . Diabetes Sister   . Heart disease Maternal Grandmother     Social History   Tobacco Use  . Smoking status: Never Smoker  . Smokeless tobacco: Never Used  Vaping Use  . Vaping Use: Never used  Substance Use Topics  . Alcohol use: No  . Drug use: No    Allergies:  Allergies  Allergen Reactions  . Cherry Itching and Swelling    Medications Prior to Admission  Medication Sig Dispense Refill Last Dose  . Prenatal Vit-Fe Fumarate-FA (PRENATAL MULTIVITAMIN) TABS tablet Take 1 tablet by mouth at bedtime.       Review of Systems  Constitutional: Negative for chills and fever.  Genitourinary: Negative for difficulty urinating, dyspareunia, dysuria, flank pain, pelvic pain, urgency and vaginal  pain.  Neurological: Negative for dizziness.   Physical Exam   Blood pressure 131/75, pulse (!) 122, temperature 97.8 F (36.6 C), temperature source Oral, resp. rate 18, height 5\' 6"  (1.676 m), weight 90.4 kg, last menstrual period 08/05/2020, SpO2 97 %, unknown if currently breastfeeding.  Physical Exam Vitals and nursing note reviewed. Exam conducted with a chaperone present.  Constitutional:      Appearance: Normal appearance.  HENT:     Head: Normocephalic and atraumatic.  Cardiovascular:     Rate and Rhythm: Normal rate.  Pulmonary:     Effort: Pulmonary effort is normal.  Abdominal:     Palpations: Abdomen is soft.  Genitourinary:    Vagina: Normal.     Cervix: Normal.     Comments: Cervix visually closed, scant white discharge in vaginal vault. No pooling of fluid.  Neurological:     Mental Status: She is alert.  Psychiatric:        Mood and Affect: Mood normal.        Behavior: Behavior normal.     MAU Course  Procedures  MDM Patient assessed at bedside Speculum exam performed - neg pooling, scant white discharge,  neg ferning exam under microscope Wet prep obtained  NST baseline 145, mod variability, pos accels, no decels - reactive NST Toco: no contractions   Stable for dc home  Assessment and Plan   Vaginal Discharge, [redacted] weeks gestation -neg SROM exam. Patient intact.  -follow up wet prep results -stable for dc home   Gita Kudo 03/13/2021, 1:12 PM

## 2021-03-13 NOTE — Discharge Instructions (Signed)
Braxton Hicks Contractions  Contractions of the uterus can occur throughout pregnancy, but they are not always a sign that you are in labor. You may have practice contractions called Braxton Hicks contractions. These false labor contractions are sometimes confused with true labor.  What are Braxton Hicks contractions?  Braxton Hicks contractions are tightening movements that occur in the muscles of the uterus before labor. Unlike true labor contractions, these contractions do not result in opening (dilation) and thinning of the cervix. Toward the end of pregnancy (32-34 weeks), Braxton Hicks contractions can happen more often and may become stronger. These contractions are sometimes difficult to tell apart from true labor because they can be very uncomfortable. You should not feel embarrassed if you go to the hospital with false labor.  Sometimes, the only way to tell if you are in true labor is for your health care provider to look for changes in the cervix. The health care provider will do a physical exam and may monitor your contractions. If you are not in true labor, the exam should show that your cervix is not dilating and your water has not broken.  If there are no other health problems associated with your pregnancy, it is completely safe for you to be sent home with false labor. You may continue to have Braxton Hicks contractions until you go into true labor.  How to tell the difference between true labor and false labor  True labor  · Contractions last 30-70 seconds.  · Contractions become very regular.  · Discomfort is usually felt in the top of the uterus, and it spreads to the lower abdomen and low back.  · Contractions do not go away with walking.  · Contractions usually become more intense and increase in frequency.  · The cervix dilates and gets thinner.  False labor  · Contractions are usually shorter and not as strong as true labor contractions.  · Contractions are usually irregular.  · Contractions  are often felt in the front of the lower abdomen and in the groin.  · Contractions may go away when you walk around or change positions while lying down.  · Contractions get weaker and are shorter-lasting as time goes on.  · The cervix usually does not dilate or become thin.  Follow these instructions at home:    · Take over-the-counter and prescription medicines only as told by your health care provider.  · Keep up with your usual exercises and follow other instructions from your health care provider.  · Eat and drink lightly if you think you are going into labor.  · If Braxton Hicks contractions are making you uncomfortable:  ? Change your position from lying down or resting to walking, or change from walking to resting.  ? Sit and rest in a tub of warm water.  ? Drink enough fluid to keep your urine pale yellow. Dehydration may cause these contractions.  ? Do slow and deep breathing several times an hour.  · Keep all follow-up prenatal visits as told by your health care provider. This is important.  Contact a health care provider if:  · You have a fever.  · You have continuous pain in your abdomen.  Get help right away if:  · Your contractions become stronger, more regular, and closer together.  · You have fluid leaking or gushing from your vagina.  · You pass blood-tinged mucus (bloody show).  · You have bleeding from your vagina.  · You have low back   pain that you never had before.  · You feel your baby’s head pushing down and causing pelvic pressure.  · Your baby is not moving inside you as much as it used to.  Summary  · Contractions that occur before labor are called Braxton Hicks contractions, false labor, or practice contractions.  · Braxton Hicks contractions are usually shorter, weaker, farther apart, and less regular than true labor contractions. True labor contractions usually become progressively stronger and regular, and they become more frequent.  · Manage discomfort from Braxton Hicks contractions  by changing position, resting in a warm bath, drinking plenty of water, or practicing deep breathing.  This information is not intended to replace advice given to you by your health care provider. Make sure you discuss any questions you have with your health care provider.  Document Revised: 09/06/2017 Document Reviewed: 02/07/2017  Elsevier Patient Education © 2021 [employer]

## 2021-03-13 NOTE — MAU Note (Signed)
Presents with c/o LOF since yesterday morning @ 1015. Reports last intercourse yesterday morning prior to LOF.  Denies VB, spotted Saturday, nothing since.  Endorses +FM.

## 2021-03-16 ENCOUNTER — Other Ambulatory Visit: Payer: Self-pay | Admitting: Obstetrics and Gynecology

## 2021-03-16 ENCOUNTER — Other Ambulatory Visit: Payer: Self-pay

## 2021-03-16 ENCOUNTER — Ambulatory Visit: Payer: BC Managed Care – PPO | Attending: Obstetrics and Gynecology

## 2021-03-16 DIAGNOSIS — Z3480 Encounter for supervision of other normal pregnancy, unspecified trimester: Secondary | ICD-10-CM

## 2021-03-23 ENCOUNTER — Encounter: Payer: Self-pay | Admitting: Obstetrics and Gynecology

## 2021-03-23 ENCOUNTER — Telehealth (INDEPENDENT_AMBULATORY_CARE_PROVIDER_SITE_OTHER): Payer: BC Managed Care – PPO | Admitting: Obstetrics and Gynecology

## 2021-03-23 VITALS — BP 120/70 | HR 93 | Wt 197.2 lb

## 2021-03-23 DIAGNOSIS — B3731 Acute candidiasis of vulva and vagina: Secondary | ICD-10-CM

## 2021-03-23 DIAGNOSIS — O98813 Other maternal infectious and parasitic diseases complicating pregnancy, third trimester: Secondary | ICD-10-CM

## 2021-03-23 DIAGNOSIS — Z3A32 32 weeks gestation of pregnancy: Secondary | ICD-10-CM

## 2021-03-23 DIAGNOSIS — B373 Candidiasis of vulva and vagina: Secondary | ICD-10-CM

## 2021-03-23 DIAGNOSIS — Z3480 Encounter for supervision of other normal pregnancy, unspecified trimester: Secondary | ICD-10-CM

## 2021-03-23 MED ORDER — FLUCONAZOLE 150 MG PO TABS: 150.0000 mg | ORAL_TABLET | Freq: Once | ORAL | 0 refills | Status: AC

## 2021-03-23 NOTE — Progress Notes (Signed)
MY CHART VIDEO VIRTUAL OBSTETRICS VISIT ENCOUNTER NOTE  I connected with Beth Meadows on 03/23/21 at  2:10 PM EDT by My Chart video at home and verified that I am speaking with the correct person using two identifiers. Provider located at [employer] for [employer] at [place].   I discussed the limitations, risks, security and privacy concerns of performing an evaluation and management service by My Chart video and the availability of in person appointments. I also discussed with the patient that there may be a patient responsible charge related to this service. The patient expressed understanding and agreed to proceed.  I discussed the limitations of telemedicine and the availability of in person appointments.  Discussed there is a possibility of technology failure and discussed alternative modes of communication if that failure occurs.  Subjective:  Beth Meadows is a 30 y.o. G3P1011 at [redacted]w[redacted]d being followed for ongoing prenatal care.  She is currently monitored for the following issues for this low-risk pregnancy and has Supervision of other normal pregnancy, antepartum on their problem list.  Patient reports she was seen in MAU because she thought her water was broken. She ws dx'd with BV and Rx'd Flagyl. She has completed that abx course, but now thinks she has a yeast infection from it. Reports fetal movement. Denies any contractions, bleeding or leaking of fluid.   The following portions of the patient's history were reviewed and updated as appropriate: allergies, current medications, past family history, past medical history, past social history, past surgical history and problem list.   Objective:   General:  Alert, oriented and cooperative.   Mental Status: Normal mood and affect perceived. Normal judgment and thought content.  Rest of physical exam deferred due to type of encounter  BP 120/70 (BP Location: Left Arm, Patient Position: Sitting, Cuff Size: Normal)    Pulse 93   Wt 197 lb 3.2 oz (89.4 kg)   LMP 08/05/2020   BMI 31.83 kg/m  **Done by patient's own at home BP cuff and scale  Assessment and Plan:  Pregnancy: G3P1011 at [redacted]w[redacted]d  1. Supervision of other normal pregnancy, antepartum - Discussed upcoming visits; MyChart @ 34 wks then in-person @ 36 wks  2. Candida vaginitis - Rx for fluconazole (DIFLUCAN) 150 MG tablet; Take 1 tablet (150 mg total) by mouth once for 1 dose.  Dispense: 1 tablet; Refill: 0  Preterm labor symptoms and general obstetric precautions including but not limited to vaginal bleeding, contractions, leaking of fluid and fetal movement were reviewed in detail with the patient.  I discussed the assessment and treatment plan with the patient. The patient was provided an opportunity to ask questions and all were answered. The patient agreed with the plan and demonstrated an understanding of the instructions. The patient was advised to call back or seek an in-person office evaluation/go to MAU at [facility] for any urgent or concerning symptoms. Please refer to After Visit Summary for other counseling recommendations.   I provided 5 minutes of non-face-to-face time during this encounter. There was 5 minutes of chart review time spent prior to this encounter. Total time spent = 10 minutes.  Return in about 4 weeks (around 04/20/2021) for Return OB w/GBS.  Future Appointments  Date Time Provider Department Center  04/06/2021 10:10 AM Raelyn Mora, CNM [facility] None  04/21/2021 10:50 AM Bernerd Limbo, CNM [facility] None  05/04/2021  2:50 PM Raelyn Mora, CNM [facility] None    Raelyn Mora, [employer] for [employer],  [place]

## 2021-04-06 ENCOUNTER — Telehealth (INDEPENDENT_AMBULATORY_CARE_PROVIDER_SITE_OTHER): Payer: BC Managed Care – PPO | Admitting: Medical

## 2021-04-06 ENCOUNTER — Encounter: Payer: Self-pay | Admitting: Medical

## 2021-04-06 ENCOUNTER — Other Ambulatory Visit: Payer: Self-pay

## 2021-04-06 VITALS — BP 115/71 | HR 127 | Wt 200.0 lb

## 2021-04-06 DIAGNOSIS — Z3A34 34 weeks gestation of pregnancy: Secondary | ICD-10-CM

## 2021-04-06 DIAGNOSIS — Z3480 Encounter for supervision of other normal pregnancy, unspecified trimester: Secondary | ICD-10-CM

## 2021-04-06 DIAGNOSIS — Z3483 Encounter for supervision of other normal pregnancy, third trimester: Secondary | ICD-10-CM

## 2021-04-06 NOTE — Progress Notes (Signed)
I connected with Beth Meadows 04/06/21 at  9:10 AM EDT by: MyChart video and verified that I am speaking with the correct person using two identifiers.  Patient is located at home and provider is located at [facility].     The purpose of this virtual visit is to provide medical care while limiting exposure to the novel coronavirus. I discussed the limitations, risks, security and privacy concerns of performing an evaluation and management service by MyChart video and the availability of in person appointments. I also discussed with the patient that there may be a patient responsible charge related to this service. By engaging in this virtual visit, you consent to the provision of healthcare.  Additionally, you authorize for your insurance to be billed for the services provided during this visit.  The patient expressed understanding and agreed to proceed.  The following staff members participated in the virtual visit:  Dorisann Frames, RN    PRENATAL VISIT NOTE  Subjective:  Beth Meadows is a 30 y.o. G3P1011 at [redacted]w[redacted]d  for phone visit for ongoing prenatal care.  She is currently monitored for the following issues for this low-risk pregnancy and has Supervision of other normal pregnancy, antepartum on their problem list.  Patient reports increased pelvic pressure, spotting once earlier this week, none since then.  Contractions: Irregular. Vag. Bleeding: None.  Movement: Present. Denies leaking of fluid.   The following portions of the patient's history were reviewed and updated as appropriate: allergies, current medications, past family history, past medical history, past social history, past surgical history and problem list.   Objective:   Vitals:   04/06/21 0909  BP: 115/71  Pulse: (!) 127  Weight: 200 lb (90.7 kg)   Self-Obtained  Fetal Status:     Movement: Present     Assessment and Plan:  Pregnancy: G3P1011 at [redacted]w[redacted]d 1. Supervision of other normal pregnancy, antepartum -  Anticipatory guidance for GBS and GC/Chlamydia at next visit discussed  - Strict warning signs for PTL warning signs discussed  - MOC planning Nexplanon at [facility] visit   2. [redacted] weeks gestation of pregnancy  Preterm labor symptoms and general obstetric precautions including but not limited to vaginal bleeding, contractions, leaking of fluid and fetal movement were reviewed in detail with the patient.  Return in about 2 weeks (around 04/20/2021) for LOB, In-Person.  Future Appointments  Date Time Provider Department Center  04/14/2021 11:10 AM Bernerd Limbo, CNM [facility] None  04/21/2021 10:50 AM Bernerd Limbo, CNM [facility] None  04/27/2021  8:50 AM Raelyn Mora, CNM [facility] None  05/04/2021  2:50 PM Raelyn Mora, CNM [facility] None  05/10/2021  1:30 PM Calvert Cantor, CNM [facility] None  05/17/2021  1:30 PM Bernerd Limbo, CNM [facility] None     Time spent on virtual visit: 12 minutes  Vonzella Nipple, PA-C

## 2021-04-14 ENCOUNTER — Other Ambulatory Visit: Payer: Self-pay

## 2021-04-14 ENCOUNTER — Other Ambulatory Visit (HOSPITAL_COMMUNITY)
Admission: RE | Admit: 2021-04-14 | Discharge: 2021-04-14 | Disposition: A | Discharge status: Home / Self Care | Payer: BC Managed Care – PPO | Source: Ambulatory Visit | Attending: Certified Nurse Midwife | Admitting: Certified Nurse Midwife

## 2021-04-14 ENCOUNTER — Ambulatory Visit (INDEPENDENT_AMBULATORY_CARE_PROVIDER_SITE_OTHER): Payer: BC Managed Care – PPO | Admitting: Certified Nurse Midwife

## 2021-04-14 VITALS — BP 114/77 | HR 76 | Wt 202.6 lb

## 2021-04-14 DIAGNOSIS — O0990 Supervision of high risk pregnancy, unspecified, unspecified trimester: Secondary | ICD-10-CM | POA: Diagnosis not present

## 2021-04-14 DIAGNOSIS — Z3493 Encounter for supervision of normal pregnancy, unspecified, third trimester: Secondary | ICD-10-CM

## 2021-04-14 DIAGNOSIS — Z3A36 36 weeks gestation of pregnancy: Secondary | ICD-10-CM

## 2021-04-14 NOTE — Progress Notes (Signed)
   PRENATAL VISIT NOTE  Subjective:  Beth Meadows is a 30 y.o. G3P1011 at [redacted]w[redacted]d being seen today for ongoing prenatal care.  She is currently monitored for the following issues for this low-risk pregnancy and has Supervision of other normal pregnancy, antepartum on their problem list.  Patient reports no complaints.  Contractions: Irregular. Vag. Bleeding: None.  Movement: Present. Denies leaking of fluid.   The following portions of the patient's history were reviewed and updated as appropriate: allergies, current medications, past family history, past medical history, past social history, past surgical history and problem list.   Objective:   Vitals:   04/14/21 1113  BP: 114/77  Pulse: 76  Weight: 202 lb 9.6 oz (91.9 kg)    Fetal Status: Fetal Heart Rate (bpm): 150 Fundal Height: 36 cm Movement: Present     General:  Alert, oriented and cooperative. Patient is in no acute distress.  Skin: Skin is warm and dry. No rash noted.   Cardiovascular: Normal heart rate noted  Respiratory: Normal respiratory effort, no problems with respiration noted  Abdomen: Soft, gravid, appropriate for gestational age.  Pain/Pressure: Present     Pelvic: Cervical exam deferred        Extremities: Normal range of motion.  Edema: Trace  Mental Status: Normal mood and affect. Normal behavior. Normal judgment and thought content.   Assessment and Plan:  Pregnancy: G3P1011 at [redacted]w[redacted]d 1. 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 [redacted] weeks gestation of pregnancy - Routine OB care  - GC/Chlamydia probe amp ([place])not at [facility] - Culture, beta strep (group b only)  Preterm labor symptoms and general obstetric precautions including but not limited to vaginal bleeding, contractions, leaking of fluid and fetal movement were reviewed in detail with the  patient. Please refer to After Visit Summary for other counseling recommendations.   Future Appointments  Date Time Provider Department Center  04/21/2021 10:50 AM Bernerd Limbo, CNM [facility] None  04/27/2021  8:50 AM Raelyn Mora, CNM [facility] None  05/04/2021  2:50 PM Raelyn Mora, CNM [facility] None  05/10/2021  1:30 PM Calvert Cantor, CNM [facility] None  05/17/2021  1:30 PM Bernerd Limbo, CNM [facility] None    Bernerd Limbo, [place]

## 2021-04-17 LAB — GC/CHLAMYDIA PROBE AMP (~~LOC~~) NOT AT ARMC
Chlamydia: NEGATIVE
Comment: NEGATIVE
Comment: NORMAL
Neisseria Gonorrhea: NEGATIVE

## 2021-04-18 LAB — CULTURE, BETA STREP (GROUP B ONLY): Strep Gp B Culture: NEGATIVE

## 2021-04-21 ENCOUNTER — Ambulatory Visit (INDEPENDENT_AMBULATORY_CARE_PROVIDER_SITE_OTHER): Payer: BC Managed Care – PPO | Admitting: Certified Nurse Midwife

## 2021-04-21 ENCOUNTER — Other Ambulatory Visit: Payer: Self-pay

## 2021-04-21 VITALS — BP 136/81 | HR 125 | Temp 98.0°F | Wt 202.0 lb

## 2021-04-21 DIAGNOSIS — Z3480 Encounter for supervision of other normal pregnancy, unspecified trimester: Secondary | ICD-10-CM

## 2021-04-21 DIAGNOSIS — Z3A37 37 weeks gestation of pregnancy: Secondary | ICD-10-CM

## 2021-04-27 ENCOUNTER — Other Ambulatory Visit: Payer: Self-pay

## 2021-04-27 ENCOUNTER — Ambulatory Visit (INDEPENDENT_AMBULATORY_CARE_PROVIDER_SITE_OTHER): Payer: BC Managed Care – PPO | Admitting: Obstetrics and Gynecology

## 2021-04-27 VITALS — BP 116/77 | HR 120 | Wt 204.2 lb

## 2021-04-27 DIAGNOSIS — Z3A37 37 weeks gestation of pregnancy: Secondary | ICD-10-CM

## 2021-04-27 DIAGNOSIS — Z3480 Encounter for supervision of other normal pregnancy, unspecified trimester: Secondary | ICD-10-CM

## 2021-04-27 NOTE — Progress Notes (Signed)
   LOW-RISK PREGNANCY OFFICE VISIT Patient name: Beth Meadows MRN [id number]  Date of birth: Feb 29, 1991 Chief Complaint:   Routine Prenatal Visit  History of Present Illness:   Beth Meadows is a 30 y.o. G25P1011 female at [redacted]w[redacted]d with an Estimated Date of Delivery: 05/12/21 being seen today for ongoing management of a low-risk pregnancy.  Today she reports occasional contractions. Contractions: Irregular. Vag. Bleeding: None.  Movement: Present. denies leaking of fluid. Review of Systems:   Pertinent items are noted in HPI Denies abnormal vaginal discharge w/ itching/odor/irritation, headaches, visual changes, shortness of breath, chest pain, abdominal pain, severe nausea/vomiting, or problems with urination or bowel movements unless otherwise stated above. Pertinent History Reviewed:  Reviewed past medical,surgical, social, obstetrical and family history.  Reviewed problem list, medications and allergies. Physical Assessment:   Vitals:   04/27/21 0855  BP: 116/77  Pulse: (!) 120  Weight: 204 lb 3.2 oz (92.6 kg)  Body mass index is 32.96 kg/m.        Physical Examination:   General appearance: Well appearing, and in no distress  Mental status: Alert, oriented to person, place, and time  Skin: Warm & dry  Cardiovascular: Normal heart rate noted  Respiratory: Normal respiratory effort, no distress  Abdomen: Soft, gravid, nontender  Pelvic: Cervical exam deferred         Extremities: Edema: None  Fetal Status: Fetal Heart Rate (bpm): 138 Fundal Height: 37 cm Movement: Present Presentation: Vertex  No results found for this or any previous visit (from the past 24 hour(s)).  Assessment & Plan:  1) Low-risk pregnancy G3P1011 at [redacted]w[redacted]d with an Estimated Date of Delivery: 05/12/21   2) Supervision of other normal pregnancy, antepartum - Reassurance given that increased pelvic pressure and increased frequency is a normal variation of this gestation - Discussed formulating birth plan  and bringing to next visit - Planning water birth  3) [redacted] weeks gestation of pregnancy    Meds: No orders of the defined types were placed in this encounter.  Labs/procedures today: none  Plan:  Continue routine obstetrical care   Reviewed: Term labor symptoms and general obstetric precautions including but not limited to vaginal bleeding, contractions, leaking of fluid and fetal movement were reviewed in detail with the patient.  All questions were answered. Has home bp cuff. Check bp weekly, let us know if >140/90.   Follow-up: No follow-ups on file.  No orders of the defined types were placed in this encounter.  Raelyn Mora MSN, CNM 04/27/2021 10:55 AM

## 2021-05-04 ENCOUNTER — Encounter: Payer: Self-pay | Admitting: Obstetrics and Gynecology

## 2021-05-04 ENCOUNTER — Ambulatory Visit (INDEPENDENT_AMBULATORY_CARE_PROVIDER_SITE_OTHER): Payer: BC Managed Care – PPO | Admitting: Obstetrics and Gynecology

## 2021-05-04 ENCOUNTER — Other Ambulatory Visit: Payer: Self-pay

## 2021-05-04 ENCOUNTER — Other Ambulatory Visit (HOSPITAL_COMMUNITY)
Admission: RE | Admit: 2021-05-04 | Discharge: 2021-05-04 | Disposition: A | Discharge status: Home / Self Care | Payer: BC Managed Care – PPO | Source: Ambulatory Visit | Attending: Obstetrics and Gynecology | Admitting: Obstetrics and Gynecology

## 2021-05-04 VITALS — BP 130/78 | HR 109 | Wt 205.4 lb

## 2021-05-04 DIAGNOSIS — B373 Candidiasis of vulva and vagina: Secondary | ICD-10-CM | POA: Diagnosis not present

## 2021-05-04 DIAGNOSIS — Z3A38 38 weeks gestation of pregnancy: Secondary | ICD-10-CM | POA: Insufficient documentation

## 2021-05-04 DIAGNOSIS — Z3480 Encounter for supervision of other normal pregnancy, unspecified trimester: Secondary | ICD-10-CM | POA: Insufficient documentation

## 2021-05-04 DIAGNOSIS — O23593 Infection of other part of genital tract in pregnancy, third trimester: Secondary | ICD-10-CM | POA: Diagnosis not present

## 2021-05-04 DIAGNOSIS — R350 Frequency of micturition: Secondary | ICD-10-CM

## 2021-05-04 DIAGNOSIS — B9689 Other specified bacterial agents as the cause of diseases classified elsewhere: Secondary | ICD-10-CM | POA: Diagnosis not present

## 2021-05-04 DIAGNOSIS — Z3483 Encounter for supervision of other normal pregnancy, third trimester: Secondary | ICD-10-CM | POA: Diagnosis present

## 2021-05-04 NOTE — Progress Notes (Signed)
   PRENATAL VISIT NOTE  Subjective:  Beth Meadows is a 30 y.o. G3P1011 at [redacted]w[redacted]d being seen today for ongoing prenatal care.  She is currently monitored for the following issues for this low-risk pregnancy and has Supervision of other normal pregnancy, antepartum on their problem list.  Patient reports no complaints.  Contractions: Irregular. Vag. Bleeding: None.  Movement: Present. Denies leaking of fluid.   The following portions of the patient's history were reviewed and updated as appropriate: allergies, current medications, past family history, past medical history, past social history, past surgical history and problem list.   Objective:   Vitals:   04/21/21 1053  BP: 136/81  Pulse: (!) 125  Temp: 98 F (36.7 C)  Weight: 202 lb (91.6 kg)    Fetal Status: Fetal Heart Rate (bpm): 142 Fundal Height: 36 cm Movement: Present     General:  Alert, oriented and cooperative. Patient is in no acute distress.  Skin: Skin is warm and dry. No rash noted.   Cardiovascular: Normal heart rate noted  Respiratory: Normal respiratory effort, no problems with respiration noted  Abdomen: Soft, gravid, appropriate for gestational age.  Pain/Pressure: Present     Pelvic: Cervical exam deferred        Extremities: Normal range of motion.  Edema: None  Mental Status: Normal mood and affect. Normal behavior. Normal judgment and thought content.   Assessment and Plan:  Pregnancy: G3P1011 at [redacted]w[redacted]d 1. Supervision of other normal pregnancy, antepartum - Doing well, feeling regular and vigorous fetal movement  2. [redacted] weeks gestation of pregnancy - Routine OB care  Term labor symptoms and general obstetric precautions including but not limited to vaginal bleeding, contractions, leaking of fluid and fetal movement were reviewed in detail with the patient. Please refer to After Visit Summary for other counseling recommendations.   Return in about 1 week (around 04/28/2021) for IN-PERSON,  LOB.  Future Appointments  Date Time Provider Department Center  05/04/2021  2:50 PM Raelyn Mora, CNM [facility] None  05/10/2021  1:30 PM Calvert Cantor, CNM [facility] None  05/17/2021  1:30 PM Bernerd Limbo, CNM [facility] None    Bernerd Limbo, [place]

## 2021-05-05 ENCOUNTER — Encounter: Payer: Self-pay | Admitting: Obstetrics and Gynecology

## 2021-05-05 MED ORDER — CEFADROXIL 500 MG PO CAPS: 500.0000 mg | ORAL_CAPSULE | Freq: Two times a day (BID) | ORAL | 0 refills | Status: DC

## 2021-05-05 NOTE — Progress Notes (Signed)
   LOW-RISK PREGNANCY OFFICE VISIT Patient name: Beth Meadows MRN [id number]  Date of birth: 1991-02-08 Chief Complaint:   Routine Prenatal Visit  History of Present Illness:   Beth Meadows is a 30 y.o. G57P1011 female at [redacted]w[redacted]d with an Estimated Date of Delivery: 05/12/21 being seen today for ongoing management of a low-risk pregnancy.  Today she reports occasional contractions and urinary frequency . She states, "I'm going way more than I usually do and it just doesn't feel right down there." Contractions: Irregular. Vag. Bleeding: None.  Movement: Present. denies leaking of fluid. Review of Systems:   Pertinent items are noted in HPI Denies abnormal vaginal discharge w/ itching/odor/irritation, headaches, visual changes, shortness of breath, chest pain, abdominal pain, severe nausea/vomiting, or problems with urination or bowel movements unless otherwise stated above. Pertinent History Reviewed:  Reviewed past medical,surgical, social, obstetrical and family history.  Reviewed problem list, medications and allergies. Physical Assessment:   Vitals:   05/04/21 1450  BP: 130/78  Pulse: (!) 109  Weight: 205 lb 6.4 oz (93.2 kg)  Body mass index is 33.15 kg/m.        Physical Examination:   General appearance: Well appearing, and in no distress  Mental status: Alert, oriented to person, place, and time  Skin: Warm & dry  Cardiovascular: Normal heart rate noted  Respiratory: Normal respiratory effort, no distress  Abdomen: Soft, gravid, nontender  Pelvic: Cervical exam performed  Dilation: Closed   Station: Ballotable  Extremities: Edema: Trace  Fetal Status: Fetal Heart Rate (bpm): 164 Fundal Height: 38 cm Movement: Present Presentation: Vertex  No results found for this or any previous visit (from the past 24 hour(s)).  Assessment & Plan:  1) Low-risk pregnancy G3P1011 at [redacted]w[redacted]d with an Estimated Date of Delivery: 05/12/21   2) Supervision of other normal pregnancy, antepartum   - Culture, OB Urine,  - Cervicovaginal ancillary only( [facility]) - Reviewed birth plan - copy under media tab - Doula is [facility], not [facility] certified - she will be her 2nd support person  3) Urine frequency  - Culture, OB Urine,  - Rx for cefadroxil (DURICEF) 500 MG capsule BID x 10 days  4) [redacted] weeks gestation of pregnancy   Meds:  Meds ordered this encounter  Medications   cefadroxil (DURICEF) 500 MG capsule    Sig: Take 1 capsule (500 mg total) by mouth 2 (two) times daily for 10 days.    Dispense:  20 capsule    Refill:  0    Order Specific Question:   Supervising Provider    Answer:   Reva Bores [[id number]]   Labs/procedures today: cervical exam  Plan:  Continue routine obstetrical care   Reviewed: Term labor symptoms and general obstetric precautions including but not limited to vaginal bleeding, contractions, leaking of fluid and fetal movement were reviewed in detail with the patient.  All questions were answered. Has home bp cuff. Check bp weekly, let us know if >140/90.   Follow-up: No follow-ups on file.  Orders Placed This Encounter  Procedures   Culture, OB Urine   Raelyn Mora MSN, CNM 05/04/2021

## 2021-05-06 LAB — CULTURE, OB URINE

## 2021-05-06 LAB — URINE CULTURE, OB REFLEX

## 2021-05-08 ENCOUNTER — Other Ambulatory Visit (INDEPENDENT_AMBULATORY_CARE_PROVIDER_SITE_OTHER): Payer: BC Managed Care – PPO | Admitting: Obstetrics and Gynecology

## 2021-05-08 DIAGNOSIS — B373 Candidiasis of vulva and vagina: Secondary | ICD-10-CM

## 2021-05-08 DIAGNOSIS — N760 Acute vaginitis: Secondary | ICD-10-CM

## 2021-05-08 DIAGNOSIS — B3731 Acute candidiasis of vulva and vagina: Secondary | ICD-10-CM

## 2021-05-08 DIAGNOSIS — B9689 Other specified bacterial agents as the cause of diseases classified elsewhere: Secondary | ICD-10-CM

## 2021-05-08 LAB — CERVICOVAGINAL ANCILLARY ONLY
Bacterial Vaginitis (gardnerella): POSITIVE — AB
Candida Glabrata: NEGATIVE
Candida Vaginitis: POSITIVE — AB
Chlamydia: NEGATIVE
Comment: NEGATIVE
Comment: NEGATIVE
Comment: NEGATIVE
Comment: NEGATIVE
Comment: NEGATIVE
Comment: NORMAL
Neisseria Gonorrhea: NEGATIVE
Trichomonas: NEGATIVE

## 2021-05-08 MED ORDER — METRONIDAZOLE 500 MG PO TABS: 500.0000 mg | ORAL_TABLET | Freq: Two times a day (BID) | ORAL | 0 refills | Status: DC

## 2021-05-08 MED ORDER — TERCONAZOLE 0.4 % VA CREA: 1.0000 | TOPICAL_CREAM | Freq: Every day | VAGINAL | 0 refills | Status: DC

## 2021-05-09 ENCOUNTER — Other Ambulatory Visit: Payer: Self-pay

## 2021-05-09 ENCOUNTER — Inpatient Hospital Stay (HOSPITAL_COMMUNITY)
Admission: AD | Admit: 2021-05-09 | Discharge: 2021-05-11 | DRG: 807 | Disposition: A | Discharge status: Home / Self Care | Payer: BC Managed Care – PPO | Attending: Obstetrics and Gynecology | Admitting: Obstetrics and Gynecology

## 2021-05-09 ENCOUNTER — Encounter (HOSPITAL_COMMUNITY): Payer: Self-pay | Admitting: Obstetrics and Gynecology

## 2021-05-09 DIAGNOSIS — O165 Unspecified maternal hypertension, complicating the puerperium: Secondary | ICD-10-CM

## 2021-05-09 DIAGNOSIS — Z20822 Contact with and (suspected) exposure to covid-19: Secondary | ICD-10-CM | POA: Diagnosis present

## 2021-05-09 DIAGNOSIS — Z3A39 39 weeks gestation of pregnancy: Secondary | ICD-10-CM

## 2021-05-09 DIAGNOSIS — Z3480 Encounter for supervision of other normal pregnancy, unspecified trimester: Secondary | ICD-10-CM

## 2021-05-09 DIAGNOSIS — O4202 Full-term premature rupture of membranes, onset of labor within 24 hours of rupture: Secondary | ICD-10-CM | POA: Diagnosis not present

## 2021-05-09 MED ORDER — ACETAMINOPHEN 500 MG PO TABS
1000.0000 mg | ORAL_TABLET | Freq: Three times a day (TID) | ORAL | Status: DC
  Administered 2021-05-10 – 2021-05-11 (×4): 1000 mg via ORAL
  Filled 2021-05-09 (×4): qty 2

## 2021-05-09 MED ORDER — IBUPROFEN 800 MG PO TABS
800.0000 mg | ORAL_TABLET | Freq: Three times a day (TID) | ORAL | Status: DC
  Administered 2021-05-09 – 2021-05-11 (×6): 800 mg via ORAL
  Filled 2021-05-09 (×6): qty 1

## 2021-05-09 MED ORDER — COCONUT OIL OIL: 1.0000 "application " | TOPICAL_OIL | Status: DC | PRN

## 2021-05-09 MED ORDER — HYDROXYZINE HCL 50 MG PO TABS: 50.0000 mg | ORAL_TABLET | Freq: Four times a day (QID) | ORAL | Status: DC | PRN

## 2021-05-09 MED ORDER — WITCH HAZEL-GLYCERIN EX PADS: 1.0000 "application " | MEDICATED_PAD | CUTANEOUS | Status: DC | PRN

## 2021-05-09 MED ORDER — OXYTOCIN 10 UNIT/ML IJ SOLN
INTRAMUSCULAR | Status: AC
  Filled 2021-05-09: qty 1

## 2021-05-09 MED ORDER — SOD CITRATE-CITRIC ACID 500-334 MG/5ML PO SOLN: 30.0000 mL | ORAL | Status: DC | PRN

## 2021-05-09 MED ORDER — DIBUCAINE (PERIANAL) 1 % EX OINT: 1.0000 "application " | TOPICAL_OINTMENT | CUTANEOUS | Status: DC | PRN

## 2021-05-09 MED ORDER — FENTANYL CITRATE (PF) 100 MCG/2ML IJ SOLN: 50.0000 ug | INTRAMUSCULAR | Status: DC | PRN

## 2021-05-09 MED ORDER — ONDANSETRON HCL 4 MG/2ML IJ SOLN: 4.0000 mg | Freq: Four times a day (QID) | INTRAMUSCULAR | Status: DC | PRN

## 2021-05-09 MED ORDER — OXYTOCIN BOLUS FROM INFUSION: 333.0000 mL | Freq: Once | INTRAVENOUS | Status: DC

## 2021-05-09 MED ORDER — DOCUSATE SODIUM 100 MG PO CAPS
100.0000 mg | ORAL_CAPSULE | Freq: Two times a day (BID) | ORAL | Status: DC
  Administered 2021-05-09 – 2021-05-11 (×3): 100 mg via ORAL
  Filled 2021-05-09 (×4): qty 1

## 2021-05-09 MED ORDER — OXYCODONE-ACETAMINOPHEN 5-325 MG PO TABS: 2.0000 | ORAL_TABLET | ORAL | Status: DC | PRN

## 2021-05-09 MED ORDER — OXYTOCIN-SODIUM CHLORIDE 30-0.9 UT/500ML-% IV SOLN: 2.5000 [IU]/h | INTRAVENOUS | Status: DC

## 2021-05-09 MED ORDER — TETANUS-DIPHTH-ACELL PERTUSSIS 5-2.5-18.5 LF-MCG/0.5 IM SUSY: 0.5000 mL | PREFILLED_SYRINGE | Freq: Once | INTRAMUSCULAR | Status: DC

## 2021-05-09 MED ORDER — LACTATED RINGERS IV SOLN: INTRAVENOUS | Status: DC

## 2021-05-09 MED ORDER — SIMETHICONE 80 MG PO CHEW: 80.0000 mg | CHEWABLE_TABLET | ORAL | Status: DC | PRN

## 2021-05-09 MED ORDER — DIPHENHYDRAMINE HCL 25 MG PO CAPS: 25.0000 mg | ORAL_CAPSULE | Freq: Four times a day (QID) | ORAL | Status: DC | PRN

## 2021-05-09 MED ORDER — ONDANSETRON HCL 4 MG PO TABS: 4.0000 mg | ORAL_TABLET | ORAL | Status: DC | PRN

## 2021-05-09 MED ORDER — LIDOCAINE HCL (PF) 1 % IJ SOLN: 30.0000 mL | INTRAMUSCULAR | Status: DC | PRN

## 2021-05-09 MED ORDER — BENZOCAINE-MENTHOL 20-0.5 % EX AERO
1.0000 "application " | INHALATION_SPRAY | CUTANEOUS | Status: DC | PRN
  Administered 2021-05-09: 1 via TOPICAL
  Filled 2021-05-09: qty 56

## 2021-05-09 MED ORDER — ONDANSETRON HCL 4 MG/2ML IJ SOLN: 4.0000 mg | INTRAMUSCULAR | Status: DC | PRN

## 2021-05-09 MED ORDER — LACTATED RINGERS IV SOLN: 500.0000 mL | INTRAVENOUS | Status: DC | PRN

## 2021-05-09 MED ORDER — MEASLES, MUMPS & RUBELLA VAC IJ SOLR: 0.5000 mL | Freq: Once | INTRAMUSCULAR | Status: DC

## 2021-05-09 MED ORDER — OXYTOCIN 10 UNIT/ML IJ SOLN: 10.0000 [IU] | Freq: Once | INTRAMUSCULAR | Status: DC

## 2021-05-09 MED ORDER — FLEET ENEMA 7-19 GM/118ML RE ENEM: 1.0000 | ENEMA | Freq: Every day | RECTAL | Status: DC | PRN

## 2021-05-09 MED ORDER — OXYCODONE-ACETAMINOPHEN 5-325 MG PO TABS: 1.0000 | ORAL_TABLET | ORAL | Status: DC | PRN

## 2021-05-09 MED ORDER — SENNOSIDES-DOCUSATE SODIUM 8.6-50 MG PO TABS
2.0000 | ORAL_TABLET | ORAL | Status: DC
  Administered 2021-05-11: 2 via ORAL
  Filled 2021-05-09 (×3): qty 2

## 2021-05-09 MED ORDER — ACETAMINOPHEN 325 MG PO TABS
650.0000 mg | ORAL_TABLET | ORAL | Status: DC | PRN
  Administered 2021-05-09: 650 mg via ORAL
  Filled 2021-05-09: qty 2

## 2021-05-09 MED ORDER — PRENATAL MULTIVITAMIN CH
1.0000 | ORAL_TABLET | Freq: Every day | ORAL | Status: DC
  Administered 2021-05-11: 1 via ORAL
  Filled 2021-05-09 (×2): qty 1

## 2021-05-09 NOTE — Lactation Note (Signed)
This note was copied from a baby's chart. Lactation Consultation Note  Patient Name: Beth Meadows EFEOF'H Date: 05/09/2021   Age: < 1hr LC called RN Shearon Balo., mother stated did not want LC service in L and D. Mom will see lactation once on the floor.   Maternal Data    Feeding    LATCH Score                    Lactation Tools Discussed/Used    Interventions    Discharge    Consult Status      [name] 05/09/2021, 6:34 PM

## 2021-05-09 NOTE — Discharge Summary (Addendum)
Postpartum Discharge Summary     Patient Name: Beth Meadows DOB: 1991-07-01 MRN: [id number]  Date of admission: 05/09/2021 Delivery date:05/09/2021  Delivering provider: Katherine Basset [facility]  Date of discharge: 05/11/2021  Admitting diagnosis: Normal labor [O80, Z37.9] Intrauterine pregnancy: [redacted]w[redacted]d    Secondary diagnosis:  Principal Problem:   Supervision of other normal pregnancy, antepartum Active Problems:   Normal labor   NSVD (normal spontaneous vaginal delivery)   Postpartum hypertension  Additional problems: None    Discharge diagnosis: Term Pregnancy Delivered and PP Hypertension                                               Post partum procedures: None Augmentation: N/A Complications: None  Hospital course: Onset of Labor With Vaginal Delivery      30y.o. yo G3P1011 at 356w4das admitted in Active Labor on 05/09/2021. Patient had an uncomplicated labor course as follows:  Membrane Rupture Time/Date: 5:11 PM ,05/09/2021   Delivery Method:Vaginal, Spontaneous  Episiotomy: None  Lacerations:  None  Patient had a postpartum course remarkable for being started on Procardia XL 3032muring her stay due to mild range elevations.  She is ambulating, tolerating a regular diet, passing flatus, and urinating well. Patient is discharged home in stable condition on 05/11/21; she will have a BP check at [facility] in 1wk.  Newborn Data: Birth date:05/09/2021  Birth time:5:17 PM  Gender:Female  Living status:Living  Apgars:8 ,9  Weight:3410 g   Magnesium Sulfate received: No BMZ received: No Rhophylac:N/A MMR:N/A T-DaP: declined Flu: N/A Transfusion:No  Physical exam  Vitals:   05/10/21 1033 05/10/21 1413 05/10/21 2102 05/11/21 0625  BP: 132/82 127/81 133/85 127/72  Pulse: 88 92 96 88  Resp: _0 Temp: 97.9 F (36.6 C) 98.1 F (36.7 C) 98.4 F (36.9 C) 98.2 F (36.8 C)  TempSrc: Oral Oral Oral Oral  SpO2: 99% 100% 100% 100%   General: alert,  cooperative, and no distress Lochia: appropriate Uterine Fundus: firm Incision: N/A DVT Evaluation: No evidence of DVT seen on physical exam. Labs: Lab Results  Component Value Date   WBC 14.7 (H) 05/10/2021   HGB 10.9 (L) 05/10/2021   HCT 31.9 (L) 05/10/2021   MCV 90.1 05/10/2021   PLT 197 05/10/2021   CMP Latest Ref Rng & Units 06/01/2015  Glucose 65 - 99 mg/dL 108(H)  BUN 6 - 20 mg/dL 7  Creatinine 0.44 - 1.00 mg/dL 0.71  Sodium 135 - 145 mmol/L 135  Potassium 3.5 - 5.1 mmol/L 3.5  Chloride 101 - 111 mmol/L 105  CO2 22 - 32 mmol/L 21(L)  Calcium 8.9 - 10.3 mg/dL 9.1  Total Protein 6.5 - 8.1 g/dL 6.8  Total Bilirubin 0.3 - 1.2 mg/dL 0.6  Alkaline Phos 38 - 126 U/L 117  AST 15 - 41 U/L 19  ALT 14 - 54 U/L 11(L)   Edinburgh Score: Edinburgh Postnatal Depression Scale Screening Tool 05/10/2021  I have been able to laugh and see the funny side of things. (No Data)     After visit meds:  Allergies as of 05/11/2021       Reactions   Cherry Itching, Swelling, Other (See Comments)        Medication List     STOP taking these medications    cefadroxil 500 MG capsule Commonly known  as: DURICEF   metroNIDAZOLE 500 MG tablet Commonly known as: FLAGYL   terconazole 0.4 % vaginal cream Commonly known as: Terazol 7       TAKE these medications    ibuprofen 800 MG tablet Commonly known as: ADVIL Take 1 tablet (800 mg total) by mouth every 8 (eight) hours.   NIFEdipine 30 MG 24 hr tablet Commonly known as: ADALAT CC Take 1 tablet (30 mg total) by mouth daily.   prenatal multivitamin Tabs tablet Take 1 tablet by mouth at bedtime.         Discharge home in stable condition Infant Feeding: Breast Infant Disposition:home with mother Discharge instruction: per After Visit Summary and Postpartum booklet. Activity: Advance as tolerated. Pelvic rest for 6 weeks.  Diet: routine diet Future Appointments: Future Appointments  Date Time Provider [place]  06/15/2021 10:55 AM Laury Deep, CNM [facility] None     Follow up Visit:  Please schedule this patient for a In person postpartum visit in 4 weeks with the following provider: Any provider. Additional Postpartum F/U:BP check 1 week and Nexplanon insertion   Low risk pregnancy complicated by:  None Delivery mode:  Vaginal, Spontaneous  Anticipated Birth Control:  Nexplanon outpatient   05/11/2021 Waldon Merl, MD  CNM attestation I have seen and examined this patient and agree with above documentation in the resident's note.   Beth Meadows is a 30 y.o. N2T5573 s/p vag del.   Pain is well controlled.  Plan for birth control is  Nexplanon .  Method of Feeding: breast  PE:  BP 123/78   Pulse (!) 105   Temp 98.1 F (36.7 C) (Oral)   Resp 20   LMP 08/05/2020   SpO2 100%   Breastfeeding Unknown  Fundus firm  Recent Labs    05/10/21 0513  HGB 10.9*  HCT 31.9*     Plan: discharge today - postpartum care discussed - f/u clinic in 1wk for BP check, then 4-6 weeks for postpartum visit and Nexplanon insertion   Myrtis Ser, CNM 10:16 PM 05/11/2021

## 2021-05-09 NOTE — MAU Note (Signed)
.  Allona Gondek is a 30 y.o. at [redacted]w[redacted]d here in MAU reporting: ctx that are 2-5 minutes apart. Denies VB or LOF. Endorses good fetal movement. GBS neg. Wants waterbirth.   Vitals:   05/09/21 1543  BP: 135/85  Pulse: (!) 113  Resp: 16  Temp: 98 F (36.7 C)     FHT:128

## 2021-05-09 NOTE — H&P (Signed)
Beth Meadows is a 30 y.o. female presenting for active labor with precipitous delivery.  Patient's CTX began at 4AM, she came in and made change from 3cm to 9.5cm with SROM with clear fluid to precipitous delivery.  See delivery note for further details.  OB History     Gravida  3   Para  1   Term  1   Preterm      AB  1   Living  1      SAB  1   IAB      Ectopic      Multiple  0   Live Births  1          Past Medical History:  Diagnosis Date   Frequent nosebleeds    Past Surgical History:  Procedure Laterality Date   WISDOM TOOTH EXTRACTION     Family History: family history includes Anemia in her mother; Asthma in her sister; Depression in her mother; Diabetes in her mother and sister; Heart disease in her maternal grandmother. Social History:  reports that she has never smoked. She has never used smokeless tobacco. She reports that she does not drink alcohol and does not use drugs.     Maternal Diabetes: No Genetic Screening: Declined Maternal Ultrasounds/Referrals: Normal Fetal Ultrasounds or other Referrals:  None Maternal Substance Abuse:  No Significant Maternal Medications:  None Significant Maternal Lab Results:  Group B Strep negative Other Comments:   N/A  Review of Systems  Constitutional:  Negative for chills and fever.  Respiratory:  Negative for cough and shortness of breath.   Cardiovascular:  Negative for chest pain.  Gastrointestinal:  Negative for abdominal pain.  Skin:  Negative for rash.  Neurological:  Negative for headaches.  History Dilation: 10 Effacement (%): 100 Station: 0 Exam by:: Thalia Bloodgood, CNM Blood pressure 132/87, pulse (!) 102, temperature 97.9 F (36.6 C), temperature source Oral, resp. rate 17, last menstrual period 08/05/2020, unknown if currently breastfeeding. Exam Physical Exam Constitutional:      General: She is not in acute distress.    Appearance: She is well-developed.  HENT:     Head:  Normocephalic and atraumatic.  Cardiovascular:     Rate and Rhythm: Normal rate.  Pulmonary:     Effort: Pulmonary effort is normal. No respiratory distress.  Musculoskeletal:        General: No tenderness.  Skin:    General: Skin is warm and dry.     Findings: No erythema or rash.  Neurological:     Mental Status: She is alert and oriented to person, place, and time.    Prenatal labs: ABO, Rh: --/Positive/-- (05/25 0000) Antibody: Negative (01/17 0000) Rubella: Immune (01/17 0000) RPR: Non Reactive (05/19 0837)  HBsAg: Negative (01/17 0000)  HIV: Non Reactive (05/19 0837)  GBS: Negative/-- (07/08 1131)   FHT on admission to MAU: 130bpm, mod var, +accels, no decels. CTX: q2-3 min.  Assessment/Plan: Beth Meadows is a 30 y.o. G3P1011 at [redacted]w[redacted]d by LMP here for precipitous delivery.  #Labor:  Precipitously delivered #Pain: Unmedicated #FWB:  Precipitous delivery, came out vigorous and crying. #ID:  GBS NEG  #MOF: Breast  #MOC: Nexplanon    Jen Mow, DO 05/09/2021, 5:55 PM

## 2021-05-10 ENCOUNTER — Encounter: Payer: BC Managed Care – PPO | Admitting: Advanced Practice Midwife

## 2021-05-10 DIAGNOSIS — O165 Unspecified maternal hypertension, complicating the puerperium: Secondary | ICD-10-CM

## 2021-05-10 HISTORY — DX: Unspecified maternal hypertension, complicating the puerperium: O16.5

## 2021-05-10 LAB — CBC
HCT: 31.9 % — ABNORMAL LOW (ref 36.0–46.0)
Hemoglobin: 10.9 g/dL — ABNORMAL LOW (ref 12.0–15.0)
MCH: 30.8 pg (ref 26.0–34.0)
MCHC: 34.2 g/dL (ref 30.0–36.0)
MCV: 90.1 fL (ref 80.0–100.0)
Platelets: 197 10*3/uL (ref 150–400)
RBC: 3.54 MIL/uL — ABNORMAL LOW (ref 3.87–5.11)
RDW: 14.5 % (ref 11.5–15.5)
WBC: 14.7 10*3/uL — ABNORMAL HIGH (ref 4.0–10.5)
nRBC: 0 % (ref 0.0–0.2)

## 2021-05-10 LAB — TYPE AND SCREEN
ABO/RH(D): O POS
Antibody Screen: NEGATIVE

## 2021-05-10 LAB — SARS CORONAVIRUS 2 (TAT 6-24 HRS): SARS Coronavirus 2: NEGATIVE

## 2021-05-10 LAB — RPR: RPR Ser Ql: NONREACTIVE

## 2021-05-10 MED ORDER — NIFEDIPINE ER OSMOTIC RELEASE 30 MG PO TB24
30.0000 mg | ORAL_TABLET | Freq: Every day | ORAL | Status: DC
  Administered 2021-05-11: 30 mg via ORAL
  Filled 2021-05-10 (×2): qty 1

## 2021-05-10 NOTE — Social Work (Signed)
CSW received consult for late Beth limited PNC.    CSW reviewed chart Beth is screening out consult as it does not meet criteria for automatic CSW involvement Beth infant drug screening.  [name] started care prior to 28 weeks Beth had more than 3 visits.  [name] established prenatal care with [facility] on January 18 at [redacted]w[redacted]d [name] had an additional appointment on March 18 at 20w Beth then transferred care to [facility].  CSW met with [name] who reported no barriers or concerns related to receiving prenatal care. No needs reported.   CSW identifies no further need for intervention Beth no barriers to discharge at this time.  CDarra Meadows Beth WEnterprise Productsand CMolson Coors Meadows([phone]

## 2021-05-10 NOTE — Progress Notes (Addendum)
Post Partum Day 1 s/p NSVD  Subjective: No complaints this morning, up ad lib, voiding, tolerating PO, and + flatus. Bleeding is appropriate. Breast feeding is going very well.  Objective: Blood pressure 131/78, pulse 80, temperature 98.2 F (36.8 C), temperature source Oral, resp. rate 18, last menstrual period 08/05/2020, SpO2 98 %, unknown if currently breastfeeding.  Physical Exam:  General: alert, cooperative, and no distress Lochia: appropriate Uterine Fundus: firm DVT Evaluation: no LE edema, no calf tenderness  Recent Labs    05/10/21 0513  HGB 10.9*  HCT 31.9*   Assessment/Plan: Beth Meadows is a 30 year old female PPD#1 after precipitous SVD at [redacted]w[redacted]d.   -Continue routine postpartum care -Consider late discharge today if appropriate versus discharge on PPD#2 -Feeding: Breast; doing well -Contraception: Nexplanon outpatient -Dispo: Discharge PPD#1-2   LOS: 1 day   Worthy Rancher Obstetrics Fellow 05/10/2021, 9:19 AM   Addendum: Reviewed patient's blood pressure trend - above goal.   Will start Procardia 30 mg and schedule 1 week BP check.   Evalina Field, MD 12:07 PM

## 2021-05-11 ENCOUNTER — Other Ambulatory Visit (HOSPITAL_COMMUNITY): Payer: Self-pay

## 2021-05-11 ENCOUNTER — Encounter (HOSPITAL_COMMUNITY): Payer: Self-pay | Admitting: Obstetrics and Gynecology

## 2021-05-11 MED ORDER — NIFEDIPINE ER 30 MG PO TB24: 30.0000 mg | ORAL_TABLET | Freq: Every day | ORAL | 2 refills | Status: DC

## 2021-05-11 MED ORDER — IBUPROFEN 800 MG PO TABS: 800.0000 mg | ORAL_TABLET | Freq: Three times a day (TID) | ORAL | 0 refills | Status: DC

## 2021-05-12 ENCOUNTER — Inpatient Hospital Stay (HOSPITAL_COMMUNITY): Admit: 2021-05-12 | Payer: Self-pay

## 2021-05-16 ENCOUNTER — Other Ambulatory Visit: Payer: Self-pay

## 2021-05-16 ENCOUNTER — Ambulatory Visit (INDEPENDENT_AMBULATORY_CARE_PROVIDER_SITE_OTHER): Payer: BC Managed Care – PPO | Admitting: *Deleted

## 2021-05-16 VITALS — BP 129/82 | HR 89 | Temp 97.9°F | Ht 66.0 in | Wt 190.6 lb

## 2021-05-16 DIAGNOSIS — Z0130 Encounter for examination of blood pressure without abnormal findings: Secondary | ICD-10-CM

## 2021-05-16 NOTE — Progress Notes (Signed)
     Subjective:  Beth Meadows is a 30 y.o. female here for BP check.   Patient reported she is not taking Nifedipine 30 mg. Patient reported that her blood pressure was elevated due to the circumstances of how her delivery went.  Hypertension ROS: not taking medications regularly as instructed, no TIA's, no chest pain on exertion, no dyspnea on exertion, no swelling of ankles, no orthostatic dizziness or lightheadedness, no orthopnea or paroxysmal nocturnal dyspnea, and no palpitations.    Objective:  BP 129/82 (BP Location: Left Arm, Patient Position: Sitting, Cuff Size: Normal)   Pulse 89   Temp 97.9 F (36.6 C) (Oral)   Ht 5\' 6"  (1.676 m)   Wt 190 lb 9.6 oz (86.5 kg)   LMP 08/05/2020   Breastfeeding Yes   BMI 30.76 kg/m   Appearance alert, well appearing, and in no distress, oriented to person, place, and time, and normal appearing weight. General exam BP noted to be well controlled today in office.    Assessment:   Blood Pressure well controlled, stable, and asymptomatic.   Plan:  Current treatment plan is effective, no change in therapy.  08/07/2020, RN

## 2021-05-17 ENCOUNTER — Encounter: Payer: BC Managed Care – PPO | Admitting: Certified Nurse Midwife

## 2021-06-15 ENCOUNTER — Other Ambulatory Visit: Payer: Self-pay

## 2021-06-15 ENCOUNTER — Ambulatory Visit (INDEPENDENT_AMBULATORY_CARE_PROVIDER_SITE_OTHER): Payer: BC Managed Care – PPO | Admitting: Obstetrics and Gynecology

## 2021-06-15 ENCOUNTER — Encounter: Payer: Self-pay | Admitting: Obstetrics and Gynecology

## 2021-06-15 DIAGNOSIS — O165 Unspecified maternal hypertension, complicating the puerperium: Secondary | ICD-10-CM

## 2021-06-15 NOTE — Progress Notes (Signed)
Post Partum Visit Note  Beth Meadows is a 30 y.o. G31P2012 female who presents for a postpartum visit. She is 5 week postpartum following a normal spontaneous vaginal delivery.  I have fully reviewed the prenatal and intrapartum course. The delivery was at [redacted]w[redacted]d gestational weeks.  Anesthesia: none. Postpartum course has been complicated by elevated BP. She was prescribed Procardia 30 mg daily, but she is "not taking it, because my BP was normal at home." She reports she is Beth Meadows stressed out with preparation for her [employer] for her [employer] at [employer]. Baby "Beth Meadows" is doing well. Baby is feeding by breast. Bleeding staining only. Bowel function is normal. Bladder function is normal. Patient is sexually active. She reports having unprotected SI on 06/05/21. Contraception method is none. Postpartum depression screening: negative.   The pregnancy intention screening data noted above was reviewed. Potential methods of contraception were discussed. The patient elected to proceed with No data recorded.   Edinburgh Postnatal Depression Scale - 06/15/21 1108       Edinburgh Postnatal Depression Scale:  In the Past 7 Days   I have been able to laugh and see the funny side of things. 0    I have looked forward with enjoyment to things. 0    I have blamed myself unnecessarily when things went wrong. 0    I have been anxious or worried for no good reason. 0    I have felt scared or panicky for no good reason. 0    Things have been getting on top of me. 0    I have been so unhappy that I have had difficulty sleeping. 0    I have felt sad or miserable. 0    I have been so unhappy that I have been crying. 0    The thought of harming myself has occurred to me. 0    Edinburgh Postnatal Depression Scale Total 0             Health Maintenance Due  Topic Date Due   COVID-19 Vaccine (3 - Booster for Pfizer series) 06/20/2020   INFLUENZA VACCINE  05/08/2021   PAP-Cervical  Cytology Screening  07/25/2021   PAP SMEAR-Modifier  07/25/2021    The following portions of the patient's history were reviewed and updated as appropriate: allergies, current medications, past family history, past medical history, past social history, past surgical history, and problem list.  Review of Systems Constitutional: negative Eyes: negative Ears, nose, mouth, throat, and face: negative Respiratory: negative Cardiovascular: negative Gastrointestinal: negative Genitourinary:negative Integument/breast: negative Hematologic/lymphatic: negative Musculoskeletal:negative Neurological: negative Behavioral/Psych: negative Endocrine: negative Allergic/Immunologic: negative  Objective:  BP (!) 137/94 (BP Location: Left Arm, Patient Position: Sitting, Cuff Size: Normal)   Pulse 96   Temp 98.5 F (36.9 C) (Oral)   Ht 5\' 6"  (1.676 m)   Wt 186 lb 9.6 oz (84.6 kg)   LMP 08/05/2020   Breastfeeding Yes   BMI 30.12 kg/m    General:  alert, cooperative, and no distress   Breasts:  not indicated  Lungs: clear to auscultation bilaterally  Heart:  regular rate and rhythm, S1, S2 normal, no murmur, click, rub or gallop  Abdomen: soft, non-tender; bowel sounds normal; no masses,  no organomegaly   Wound N/A  GU exam:  not indicated       Assessment:   Encounter for postpartum care of lactating mother Normal postpartum exam.   Postpartum hypertension   Plan:   Essential components of  care per ACOG recommendations:  1.  Mood and well being: Patient with negative depression screening today. Reviewed local resources for support.  - Patient tobacco use? No.   - hx of drug use? No.    2. Infant care and feeding:  -Patient currently breastmilk feeding? Yes. Discussed returning to work and pumping. Reviewed importance of draining breast regularly to support lactation.  -Social determinants of health (SDOH) reviewed in EPIC. No concerns  3. Sexuality, contraception and birth  spacing - Patient does not want a pregnancy in the next year.  Desired family size is 2 children.  - Reviewed forms of contraception in tiered fashion. Patient desired  Nexplanon  today.   - Discussed birth spacing of 18 months  4. Sleep and fatigue -Encouraged family/partner/community support of 4 hrs of uninterrupted sleep to help with mood and fatigue  5. Physical Recovery  - Discussed patients delivery and complications. She describes her labor as good. - Patient had a Vaginal problems after delivery including precipitous delivery and not able to have waterbirth as planned d/t delay in admission from MAU . Patient had  no  laceration.  - Patient has urinary incontinence? No. - Patient is safe to resume physical and sexual activity  6.  Health Maintenance - HM due items addressed Yes - Last pap smear 07/2018 Pap smear not done at today's visit. Will schedule pap for 07/2021. -Breast Cancer screening indicated? No.   7. Chronic Disease/Pregnancy Condition follow up: Hypertension. Discussion with patient of the necessity of taking Procardia as prescribed. Patient declines taking Procardia. She will monitor BP at home and send numbers via MyChart messaging.  - PCP follow up  Raelyn Mora, CNM [facility] [employer], [facility]

## 2021-06-21 ENCOUNTER — Encounter: Payer: Self-pay | Admitting: Obstetrics and Gynecology

## 2021-06-21 ENCOUNTER — Other Ambulatory Visit: Payer: Self-pay

## 2021-06-21 ENCOUNTER — Ambulatory Visit (INDEPENDENT_AMBULATORY_CARE_PROVIDER_SITE_OTHER): Payer: BC Managed Care – PPO | Admitting: Obstetrics and Gynecology

## 2021-06-21 VITALS — BP 128/87 | HR 98 | Temp 97.6°F | Ht 66.0 in | Wt 189.0 lb

## 2021-06-21 DIAGNOSIS — Z3202 Encounter for pregnancy test, result negative: Secondary | ICD-10-CM

## 2021-06-21 DIAGNOSIS — Z30017 Encounter for initial prescription of implantable subdermal contraceptive: Secondary | ICD-10-CM

## 2021-06-21 LAB — POCT URINE PREGNANCY: Preg Test, Ur: NEGATIVE

## 2021-06-21 MED ORDER — ETONOGESTREL 68 MG ~~LOC~~ IMPL
68.0000 mg | DRUG_IMPLANT | Freq: Once | SUBCUTANEOUS | Status: AC
  Administered 2021-06-21: 68 mg via SUBCUTANEOUS

## 2021-06-21 NOTE — Progress Notes (Signed)
GYNECOLOGY OFFICE PROCEDURE NOTE   Ms. Beth Meadows is a 30 y.o. [id number] here for Nexplanon insertion. No complaints. UPT ordered today.  BP 128/87 (BP Location: Left Arm, Patient Position: Sitting, Cuff Size: Large)   Pulse 98   Temp 97.6 F (36.4 C) (Oral)   Ht 5\' 6"  (1.676 m)   Wt 189 lb (85.7 kg)   LMP 06/21/2021   Breastfeeding Yes   BMI 30.51 kg/m    Results for orders placed or performed in visit on 06/21/21 (from the past 24 hour(s))  POCT urine pregnancy     Status: Normal   Collection Time: 06/21/21  8:54 AM  Result Value Ref Range   Preg Test, Ur Negative Negative      Nexplanon Insertion Procedure Patient identified, informed consent performed, consent signed.   Patient does understand that irregular bleeding is a very common side effect of this medication. She was advised to have backup contraception for one week after placement. Pregnancy test in clinic today was negative.  Appropriate time out taken.  Patient's left arm was prepped and draped in the usual sterile fashion. The ruler used to measure and mark insertion area.  Patient was prepped with alcohol swab and then injected with 3 ml of 1% lidocaine.  She was prepped with betadine, Nexplanon removed from packaging,  Device confirmed in needle, then inserted full length of needle and withdrawn per handbook instructions. Nexplanon was able to palpated in the patient's arm; patient palpated the insert herself. There was minimal blood loss.  Patient insertion site covered with guaze and a pressure bandage to reduce any bruising.  The patient tolerated the procedure well and was given post procedure instructions. Follow-up via My Chart video visit , unless having problems and need to be seen in the office.  Nexplanon Lot#: 06/23/21 / Expiration Date: 01/15/2023  03/17/2023, CNM  06/21/2021 9:12 AM

## 2021-06-21 NOTE — Patient Instructions (Signed)
Nexplanon Instructions After Insertion   Keep bandage clean and dry for 24 hours   Keep the steri-strips on your arm until they fall off. This usually takes 1-2 wks. If they fall off before 1 week, replace them with the ones we provided you.   May use ice/Tylenol/Ibuprofen for soreness or pain   If you develop fever, drainage or increased warmth from incision site-contact office immediately   

## 2021-07-20 ENCOUNTER — Telehealth: Payer: BC Managed Care – PPO | Admitting: Obstetrics and Gynecology

## 2022-12-03 ENCOUNTER — Other Ambulatory Visit: Payer: Self-pay | Admitting: Family Medicine

## 2022-12-03 DIAGNOSIS — Z1231 Encounter for screening mammogram for malignant neoplasm of breast: Secondary | ICD-10-CM

## 2022-12-28 ENCOUNTER — Other Ambulatory Visit: Payer: Self-pay | Admitting: Family

## 2022-12-28 DIAGNOSIS — N912 Amenorrhea, unspecified: Secondary | ICD-10-CM

## 2023-01-09 ENCOUNTER — Ambulatory Visit
Admission: RE | Admit: 2023-01-09 | Discharge: 2023-01-09 | Disposition: A | Discharge status: Home / Self Care | Payer: BC Managed Care – PPO | Source: Ambulatory Visit

## 2023-01-09 DIAGNOSIS — Z1231 Encounter for screening mammogram for malignant neoplasm of breast: Secondary | ICD-10-CM

## 2023-01-11 ENCOUNTER — Other Ambulatory Visit: Payer: Self-pay | Admitting: Family Medicine

## 2023-01-11 DIAGNOSIS — R928 Other abnormal and inconclusive findings on diagnostic imaging of breast: Secondary | ICD-10-CM

## 2023-01-24 ENCOUNTER — Ambulatory Visit
Admission: RE | Admit: 2023-01-24 | Discharge: 2023-01-24 | Disposition: A | Discharge status: Home / Self Care | Payer: BC Managed Care – PPO | Source: Ambulatory Visit | Attending: Family | Admitting: Family

## 2023-01-24 ENCOUNTER — Ambulatory Visit
Admission: RE | Admit: 2023-01-24 | Discharge: 2023-01-24 | Disposition: A | Discharge status: Home / Self Care | Payer: BC Managed Care – PPO | Source: Ambulatory Visit | Attending: Family Medicine | Admitting: Family Medicine

## 2023-01-24 DIAGNOSIS — R928 Other abnormal and inconclusive findings on diagnostic imaging of breast: Secondary | ICD-10-CM

## 2023-01-24 DIAGNOSIS — N912 Amenorrhea, unspecified: Secondary | ICD-10-CM

## 2023-01-25 ENCOUNTER — Other Ambulatory Visit: Payer: Self-pay | Admitting: Family Medicine

## 2023-01-25 DIAGNOSIS — N6310 Unspecified lump in the right breast, unspecified quadrant: Secondary | ICD-10-CM

## 2023-02-05 ENCOUNTER — Ambulatory Visit
Admission: RE | Admit: 2023-02-05 | Discharge: 2023-02-05 | Disposition: A | Discharge status: Home / Self Care | Payer: BC Managed Care – PPO | Source: Ambulatory Visit | Attending: Family Medicine | Admitting: Family Medicine

## 2023-02-05 DIAGNOSIS — N6310 Unspecified lump in the right breast, unspecified quadrant: Secondary | ICD-10-CM

## 2023-02-05 HISTORY — PX: BREAST BIOPSY: SHX20

## 2023-04-17 ENCOUNTER — Ambulatory Visit (INDEPENDENT_AMBULATORY_CARE_PROVIDER_SITE_OTHER): Payer: BC Managed Care – PPO | Admitting: Obstetrics & Gynecology

## 2023-04-17 ENCOUNTER — Encounter: Payer: Self-pay | Admitting: Obstetrics & Gynecology

## 2023-04-17 ENCOUNTER — Other Ambulatory Visit: Payer: Self-pay

## 2023-04-17 VITALS — BP 127/82 | HR 79 | Wt 195.2 lb

## 2023-04-17 DIAGNOSIS — N926 Irregular menstruation, unspecified: Secondary | ICD-10-CM | POA: Diagnosis not present

## 2023-04-17 NOTE — Progress Notes (Signed)
   GYNECOLOGY OFFICE VISIT NOTE  History:   Beth Meadows is a 32 y.o. [id number] here today for evaluation of abnormal bleeding in the setting of hormonal therapy.  Has been on different hormonal regimens; currently on Micronor which causes her amenorrhea.  Had irregular bleeding previously, treated with combined OCPs in the past.  Normal menstrual periods prior to initiation of birth control.  Passed Provera challenge done by her PCP,  at [facility], had U/S on 01/24/23 that reveal a 0.7 x 0.6 x 0.7 cm uterine fibroid.  She is on POPs now due to history of HTN.  She denies any abnormal vaginal discharge, bleeding, pelvic pain or other concerns.    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   Health Maintenance:  Normal pap and negative HRHPV on 02/05/23.    Review of Systems:  Pertinent items noted in HPI and remainder of comprehensive ROS otherwise negative.  Physical Exam:  BP 127/82   Pulse 79   Wt 195 lb 3.2 oz (88.5 kg)   LMP 02/17/2023   BMI 31.51 kg/m  CONSTITUTIONAL: Well-developed, well-nourished female in no acute distress.  HEENT:  Normocephalic, atraumatic. External right and left ear normal. No scleral icterus.  NECK: Normal range of motion, supple, no masses noted on observation SKIN: No rash noted. Not diaphoretic. No erythema. No pallor. MUSCULOSKELETAL: Normal range of motion. No edema noted. NEUROLOGIC: Alert and oriented to person, place, and time. Normal muscle  tone coordination. No cranial nerve deficit noted. PSYCHIATRIC: Normal mood and affect. Normal behavior. Normal judgment and thought content. CARDIOVASCULAR: Normal heart rate noted RESPIRATORY: Effort and breath sounds normal, no problems with respiration noted ABDOMEN: No masses noted. No other overt distention noted.   PELVIC: Deferred     Assessment and Plan:    1. Irregular menstrual bleeding Patient is currently on continuous POPs (Micronor), she was reassured that amenorrhea can be a side effect of this therapy. Discussed mechanism of actions of hormones on the female reproductive system.  All questions answered. Offered for her to be off all hormones, suggested Copper IUD for birth control but she declined. She is considering tubal surgery later in life, husband doe snot want vasectomy.  She was told to call back if she has any further concerns.   Routine preventative health maintenance measures emphasized. Please refer to After Visit Summary for other counseling recommendations.   No follow-ups on file.    I spent 30 minutes dedicated to the care of this patient including pre-visit review of records, face to face time with the patient discussing her conditions and treatments and post visit orders.    Jaynie Collins, MD, FACOG Obstetrician & Gynecologist, [facility] for [employer], [facility]

## 2024-04-02 ENCOUNTER — Other Ambulatory Visit: Payer: Self-pay | Admitting: Family

## 2024-04-02 DIAGNOSIS — Z1231 Encounter for screening mammogram for malignant neoplasm of breast: Secondary | ICD-10-CM

## 2024-04-02 DIAGNOSIS — Z803 Family history of malignant neoplasm of breast: Secondary | ICD-10-CM

## 2024-04-07 ENCOUNTER — Ambulatory Visit
Admission: RE | Admit: 2024-04-07 | Discharge: 2024-04-07 | Disposition: A | Discharge status: Home / Self Care | Source: Ambulatory Visit | Attending: Family | Admitting: Family

## 2024-04-07 DIAGNOSIS — Z1231 Encounter for screening mammogram for malignant neoplasm of breast: Secondary | ICD-10-CM

## 2024-04-07 DIAGNOSIS — Z803 Family history of malignant neoplasm of breast: Secondary | ICD-10-CM

## 2024-04-09 ENCOUNTER — Other Ambulatory Visit: Payer: Self-pay | Admitting: Family

## 2024-04-09 DIAGNOSIS — M7989 Other specified soft tissue disorders: Secondary | ICD-10-CM

## 2024-05-13 ENCOUNTER — Ambulatory Visit
Admission: RE | Admit: 2024-05-13 | Discharge: 2024-05-13 | Disposition: A | Discharge status: Home / Self Care | Source: Ambulatory Visit | Attending: Family | Admitting: Family

## 2024-05-13 DIAGNOSIS — M7989 Other specified soft tissue disorders: Secondary | ICD-10-CM

## 2024-05-21 ENCOUNTER — Other Ambulatory Visit: Payer: Self-pay | Admitting: Family

## 2024-05-21 DIAGNOSIS — Z803 Family history of malignant neoplasm of breast: Secondary | ICD-10-CM

## 2024-05-27 ENCOUNTER — Encounter: Payer: Self-pay | Admitting: Physician Assistant

## 2024-05-27 ENCOUNTER — Ambulatory Visit (INDEPENDENT_AMBULATORY_CARE_PROVIDER_SITE_OTHER): Admitting: Physician Assistant

## 2024-05-27 VITALS — BP 132/84 | HR 80 | Ht 66.0 in | Wt 184.4 lb

## 2024-05-27 DIAGNOSIS — E611 Iron deficiency: Secondary | ICD-10-CM | POA: Diagnosis not present

## 2024-05-27 DIAGNOSIS — R194 Change in bowel habit: Secondary | ICD-10-CM | POA: Diagnosis not present

## 2024-05-27 DIAGNOSIS — K625 Hemorrhage of anus and rectum: Secondary | ICD-10-CM

## 2024-05-27 MED ORDER — NA SULFATE-K SULFATE-MG SULF 17.5-3.13-1.6 GM/177ML PO SOLN: 1.0000 | Freq: Once | ORAL | 0 refills | Status: AC

## 2024-05-27 NOTE — Patient Instructions (Signed)
 Increase fiber to 25-35 gram daily through diet and supplements.   You have been scheduled for a colonoscopy. Please follow written instructions given to you at your visit today.   If you use inhalers (even only as needed), please bring them with you on the day of your procedure.  DO NOT TAKE 7 DAYS PRIOR TO TEST- Trulicity (dulaglutide) Ozempic, Wegovy (semaglutide) Mounjaro (tirzepatide) Bydureon Bcise (exanatide extended release)  DO NOT TAKE 1 DAY PRIOR TO YOUR TEST Rybelsus (semaglutide) Adlyxin (lixisenatide) Victoza (liraglutide) Byetta (exanatide) _______________________________________________________________________  _______________________________________________________  If your blood pressure at your visit was 140/90 or greater, please contact your primary care physician to follow up on this.  _______________________________________________________  If you are age 50 or older, your body mass index should be between 23-30. Your Body mass index is 29.76 kg/m. If this is out of the aforementioned range listed, please consider follow up with your Primary Care Provider.  If you are age 1 or younger, your body mass index should be between 19-25. Your Body mass index is 29.76 kg/m. If this is out of the aformentioned range listed, please consider follow up with your Primary Care Provider.   ________________________________________________________  The [facility] providers would like to encourage you to use MYCHART to communicate with providers for non-urgent requests or questions.  Due to long hold times on the telephone, sending your provider a message by [facility] may be a faster and more efficient way to get a response.  Please allow 48 business hours for a response.  Please remember that this is for non-urgent requests.  _______________________________________________________  [facility] is using a team-based approach to care.  Your team is made up of your  doctor and two to three APPS. Our APPS (Nurse Practitioners and Physician Assistants) work with your physician to ensure care continuity for you. They are fully qualified to address your health concerns and develop a treatment plan. They communicate directly with your gastroenterologist to care for you. Seeing the Advanced Practice Practitioners on your physician's team can help you by facilitating care more promptly, often allowing for earlier appointments, access to diagnostic testing, procedures, and other specialty referrals.

## 2024-05-27 NOTE — Progress Notes (Signed)
 Chief Complaint: Blood in stool and change in bowel habits  HPI:    Beth Meadows is a 33 year old African-American female with a past medical history as listed below, who was referred to me by Duwaine Annabella SAILOR, FNP for a complaint of blood in stool and change in bowel habits.    03/24/2024 CBC with an iron saturation low at 13%, ferritin 44, hemoglobin 13.8.  Normal CMP, hemoglobin A1c, TSH, vitamin D.    Today, patient presents to clinic and tells me that she has had a change in bowel habits towards constipation over the past 6 months or so.  Tells me her body is just now recovering after having her last child 3 years ago.  In fact she went for months without a period and then had 3 weeks of bleeding.  Also describes having nosebleeds with clots and tells me she was aware her iron was low and likely this is the reason.  Has also seen rectal bleeding almost every time she has a bowel movement, wiping and seeing it on the toilet paper.  Describes having hemorrhoids at a younger age and thought this was the reason.  Her mother urged her to come to the office though given that 2 of her maternal uncles and 1 paternal uncle as well as her grandfather all had colon cancer and one of her cousins has a lot of polyps.  Her mother herself gets colonoscopies every 2 years.  Does have some abdominal discomfort with days of constipation.  Prior to the past few months had 2 daily bowel movements like clockwork.    Denies fever, chills, weight loss, nausea, vomiting or symptoms that awaken her from sleep.  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     fluticasone (FLONASE) 50 MCG/ACT nasal spray Place 1 spray into both nostrils daily.     loratadine (CLARITIN) 10 MG tablet Take by mouth.     Multiple Vitamin (MULTI-VITAMIN) tablet Take 1 tablet by mouth daily.     norethindrone (MICRONOR) 0.35 MG tablet Take 1 tablet by mouth daily.     Prenatal Vit-Fe Fumarate-FA (PRENATAL MULTIVITAMIN) TABS tablet Take 1 tablet by mouth at bedtime.     No current facility-administered medications for this visit.    Allergies as of 05/27/2024 - Review Complete 05/27/2024  Allergen Reaction Noted   Cherry Itching, Swelling, and Other (See Comments) 07/01/2020    Family History  Problem Relation Age of Onset   Anemia Mother    Diabetes Mother    Depression Mother    Healthy Father    Breast cancer Sister 55   Asthma Sister    Diabetes Sister    Healthy Brother    Mental illness Maternal Grandmother    Colon cancer Paternal Grandfather    Breast cancer Maternal Aunt        42s    Social History   Socioeconomic History   Marital status: Married    Spouse name: Laural Eiland  Number of children: 3   Years of education: Not on file   Highest education level: Bachelor's degree (e.g., BA, AB, BS)  Occupational History   Occupation: Energy manager  Tobacco Use   Smoking status: Never   Smokeless tobacco: Never  Vaping Use   Vaping status: Never Used  Substance and Sexual Activity   Alcohol use: No   Drug use: No   Sexual activity: Yes    Birth control/protection: None  Other Topics Concern   Not on file  Social History Narrative   Not on file   Social Drivers of Health   Financial Resource Strain: Not at Risk (12/28/2022)   Received from [employer]    Financial Resource Strain: 1  Food Insecurity: Not at Risk (03/24/2024)   Received from [facility] Insecurity    Within the past 12 months, the food  you bought just didn't last and you didn't have enough money to get more.: 1  Transportation Needs: Not at Risk (03/24/2024)   Received from [employer] Needs    In the past 12 months, has lack of transportation kept you from medical appointments, meetings, work or from getting things needed for daily living? (Check all that apply): 1  Physical Activity: Not at Risk (12/28/2022)   Received from [facility]   Physical Activity    Physical Activity: 1  Stress: Not on File (04/09/2022)   Received from [facility]   Stress    Stress: 0  Social Connections: Not on File (06/13/2023)   Received from [employer] [facility]   Social Connections    Connectedness: 0  Intimate Partner Violence: Not on file    Review of Systems:    Constitutional: No weight loss, fever or chills Skin: No rash  Cardiovascular: No chest pain Respiratory: No SOB  Gastrointestinal: See HPI and otherwise negative Genitourinary: No dysuria  Neurological: No headache, dizziness or syncope Musculoskeletal: No new muscle or joint pain Hematologic: No bruising Psychiatric: No history of depression or anxiety   Physical Exam:  Vital signs: BP 132/84 (BP Location: Left Arm, Patient Position: Sitting, Cuff Size: Normal)   Pulse 80   Ht 5' 6 (1.676 m)   Wt 184 lb 6 oz (83.6 kg)   LMP 05/13/2024 (Exact Date)   BMI 29.76 kg/m    Constitutional:   Pleasant AA  female appears to be in NAD, Well developed, Well nourished, alert and cooperative Head:  Normocephalic and atraumatic. Eyes:   PEERL, EOMI. No icterus. Conjunctiva pink. Ears:  Normal auditory acuity. Neck:  Supple Throat: Oral cavity and pharynx without inflammation, swelling or lesion.  Respiratory: Respirations even and unlabored. Lungs clear to auscultation bilaterally.   No wheezes, crackles, or rhonchi.  Cardiovascular: Normal S1, S2. No MRG. Regular rate and rhythm. No peripheral edema, cyanosis or pallor.  Gastrointestinal:  Soft, nondistended, nontender. No rebound or  guarding. Normal bowel sounds. No appreciable masses or hepatomegaly. Rectal:  Declined Msk:  Symmetrical without gross deformities. Without edema, no deformity or joint abnormality.  Neurologic:  Alert and  oriented x4;  grossly normal neurologically.  Skin:   Dry and intact without significant lesions or rashes. Psychiatric: Demonstrates good judgement and reason without abnormal affect or behaviors.  See HPI for most recent labs and imaging.  Assessment: 1.  Change in bowel habits: Towards constipation, likely related to trying to achieve weight loss after her third child and diet changes 2.  Rectal bleeding: Reports months  of seeing bright red blood on the toilet paper off and on with wiping, previously blamed this on hemorrhoids, family history of colon cancer in 2 maternal uncles and 1 paternal uncle as well as her grandfather likely; consider hemorrhoids versus polyps versus other 3.  Iron deficient: Recent labs showing an iron percent saturation low at 13, patient reports heavy nosebleeds with clots and 3 weeks of a.  At that point; likely due to menstrual bleeding and/or nosebleeds  Plan: 1.  Patient is nervous about her symptoms and family history.  Went ahead and scheduled her for a diagnostic colonoscopy in the [facility] with Dr. Shila.  This will be done for change in bowel habits and rectal bleeding.  Did provide the patient a detailed list of risks for the procedure and she agrees to proceed. Patient is appropriate for endoscopic procedure(s) in the ambulatory ([facility]) setting.  2.  Encouraged the patient to increase fiber in her diet with use of fiber supplement such as Metamucil, Citrucel or Benefiber or a fiber gummy.  Also increase water to at least half her body weight in ounces and increase exercise.  Suggested a laxative but she does not want to start all of that. 3.  Patient to follow in clinic per recommendations after time of procedure.  Delon Failing, PA-C [facility] 05/27/2024, 9:49 AM  Cc: Duwaine Annabella SAILOR, FNP

## 2024-06-16 ENCOUNTER — Encounter: Payer: Self-pay | Admitting: Family

## 2024-06-17 ENCOUNTER — Encounter: Payer: Self-pay | Admitting: Family

## 2024-06-18 ENCOUNTER — Encounter: Payer: Self-pay | Admitting: Gastroenterology

## 2024-06-20 ENCOUNTER — Ambulatory Visit
Admission: RE | Admit: 2024-06-20 | Discharge: 2024-06-20 | Disposition: A | Discharge status: Home / Self Care | Source: Ambulatory Visit | Attending: Family | Admitting: Family

## 2024-06-20 DIAGNOSIS — Z803 Family history of malignant neoplasm of breast: Secondary | ICD-10-CM

## 2024-06-20 MED ORDER — GADOPICLENOL 0.5 MMOL/ML IV SOLN
8.0000 mL | Freq: Once | INTRAVENOUS | Status: AC | PRN
  Administered 2024-06-20: 8 mL via INTRAVENOUS

## 2024-06-25 ENCOUNTER — Encounter: Payer: Self-pay | Admitting: Gastroenterology

## 2024-06-25 ENCOUNTER — Ambulatory Visit (AMBULATORY_SURGERY_CENTER): Admitting: Gastroenterology

## 2024-06-25 VITALS — BP 139/100 | HR 82 | Temp 98.0°F | Resp 19 | Ht 66.0 in | Wt 186.0 lb

## 2024-06-25 DIAGNOSIS — K648 Other hemorrhoids: Secondary | ICD-10-CM

## 2024-06-25 DIAGNOSIS — K644 Residual hemorrhoidal skin tags: Secondary | ICD-10-CM | POA: Diagnosis not present

## 2024-06-25 DIAGNOSIS — K625 Hemorrhage of anus and rectum: Secondary | ICD-10-CM | POA: Diagnosis not present

## 2024-06-25 DIAGNOSIS — E611 Iron deficiency: Secondary | ICD-10-CM

## 2024-06-25 MED ORDER — SODIUM CHLORIDE 0.9 % IV SOLN: 500.0000 mL | Freq: Once | INTRAVENOUS | Status: DC

## 2024-06-25 MED ORDER — HYDROCORTISONE (PERIANAL) 2.5 % EX CREA: 1.0000 | TOPICAL_CREAM | Freq: Every day | CUTANEOUS | 1 refills | Status: AC

## 2024-06-25 NOTE — Op Note (Addendum)
 [facility] Patient Name: Beth Meadows Procedure Date: 06/25/2024 4:13 PM MRN: [id number] Endoscopist: Gustav ALONSO Mcgee , MD, [id number] Age: 33 Referring MD:  Date of Birth: 04-05-1991 Gender: Female Account #: [id number] Procedure:                Colonoscopy Indications:              Evaluation of unexplained GI bleeding presenting                            with Hematochezia, Unexplained iron deficiency                            anemia Medicines:                Monitored Anesthesia Care Procedure:                Pre-Anesthesia Assessment:                           - Prior to the procedure, a History and Physical                            was performed, and patient medications and                            allergies were reviewed. The patient's tolerance of                            previous anesthesia was also reviewed. The risks                            and benefits of the procedure and the sedation                            options and risks were discussed with the patient.                            All questions were answered, and informed consent                            was obtained. Prior Anticoagulants: The patient has                            taken no anticoagulant or antiplatelet agents. ASA                            Grade Assessment: II - A patient with mild systemic                            disease. After reviewing the risks and benefits,                            the patient was deemed in satisfactory condition to  undergo the procedure.                           After obtaining informed consent, the colonoscope                            was passed under direct vision. Throughout the                            procedure, the patient's blood pressure, pulse, and                            oxygen saturations were monitored continuously. The                            [id number]-HQ190L Colonoscope [id number] was introduced                             through the anus and advanced to the the cecum,                            identified by appendiceal orifice and ileocecal                            valve. The colonoscopy was performed without                            difficulty. The patient tolerated the procedure                            well. The quality of the bowel preparation was                            good. The ileocecal valve, appendiceal orifice, and                            rectum were photographed. Scope In: 4:26:38 PM Scope Out: 4:38:51 PM Scope Withdrawal Time: 0 hours 7 minutes 19 seconds  Total Procedure Duration: 0 hours 12 minutes 13 seconds  Findings:                 The perianal and digital rectal examinations were                            normal.                           Non-bleeding external and internal hemorrhoids were                            found during retroflexion. The hemorrhoids were                            small.  The exam was otherwise without abnormality. Complications:            No immediate complications. Estimated Blood Loss:     Estimated blood loss: none. Impression:               - Non-bleeding external and internal hemorrhoids.                           - The examination was otherwise normal.                           - No specimens collected. Recommendation:           - Resume previous diet.                           - Continue present medications.                           - Repeat colonoscopy at age 40 for screening                            purposes. Family h/o colon cancer in multiple                            relatives                           - Use Analpram HC Cream 2.5%: Apply externally as                            necessary daily X 7 days. Betsi Crespi V. Yiselle Babich, MD 06/25/2024 4:43:50 PM This report has been signed electronically.

## 2024-06-25 NOTE — Progress Notes (Signed)
 Vss nad trans to pacu

## 2024-06-25 NOTE — Patient Instructions (Signed)
 YOU HAD AN ENDOSCOPIC PROCEDURE TODAY AT THE [facility]:   Refer to the procedure report that was given to you for any specific questions about what was found during the examination.  If the procedure report does not answer your questions, please call your gastroenterologist to clarify.  If you requested that your care partner not be given the details of your procedure findings, then the procedure report has been included in a sealed envelope for you to review at your convenience later.  YOU SHOULD EXPECT: Some feelings of bloating in the abdomen. Passage of more gas than usual.  Walking can help get rid of the air that was put into your GI tract during the procedure and reduce the bloating. If you had a lower endoscopy (such as a colonoscopy or flexible sigmoidoscopy) you may notice spotting of blood in your stool or on the toilet paper. If you underwent a bowel prep for your procedure, you may not have a normal bowel movement for a few days.  Please Note:  You might notice some irritation and congestion in your nose or some drainage.  This is from the oxygen used during your procedure.  There is no need for concern and it should clear up in a day or so.  SYMPTOMS TO REPORT IMMEDIATELY:  Following lower endoscopy (colonoscopy or flexible sigmoidoscopy):  Excessive amounts of blood in the stool  Significant tenderness or worsening of abdominal pains  Swelling of the abdomen that is new, acute  Fever of 100F or higher  Resume previous diet Continue present medications Repeat colonoscopy at age 21 for screening purposes Use Analpram HC cream 2.5%.  Apply externally as necessary daily x 7 days    For urgent or emergent issues, a gastroenterologist can be reached at any hour by calling [phone]. Do not use MyChart messaging for urgent concerns.    DIET:  We do recommend a small meal at first, but then you may proceed to your regular diet.  Drink plenty of fluids but you  should avoid alcoholic beverages for 24 hours.  ACTIVITY:  You should plan to take it easy for the rest of today and you should NOT DRIVE or use heavy machinery until tomorrow (because of the sedation medicines used during the test).    FOLLOW UP: Our staff will call the number listed on your records the next business day following your procedure.  We will call around 7:15- 8:00 am to check on you and address any questions or concerns that you may have regarding the information given to you following your procedure. If we do not reach you, we will leave a message.     If any biopsies were taken you will be contacted by phone or by letter within the next 1-3 weeks.  Please call us  at [phone] if you have not heard about the biopsies in 3 weeks.    SIGNATURES/CONFIDENTIALITY: You and/or your care partner have signed paperwork which will be entered into your electronic medical record.  These signatures attest to the fact that that the information above on your After Visit Summary has been reviewed and is understood.  Full responsibility of the confidentiality of this discharge information lies with you and/or your care-partner.

## 2024-06-25 NOTE — Progress Notes (Signed)
 [facility] History and Physical   Primary Care Physician:  Pcp, No   Reason for Procedure:  Rectal bleeding  Plan:     colonoscopy with possible interventions as needed     HPI: Beth Meadows is a very pleasant 33 y.o. female here for colonoscopy for evaluation of rectal bleeding, family h/o colon cancer in multiple second degree relatives. Please refer to office visit note by Delon Failing for additional details  The risks and benefits as well as alternatives of endoscopic procedure(s) have been discussed and reviewed. All questions answered. The patient agrees to proceed.    Past Medical History:  Diagnosis Date   Chronic hypertension    Frequent nosebleeds    Postpartum hypertension 05/10/2021    Past Surgical History:  Procedure Laterality Date   BREAST BIOPSY Right 02/05/2023   US  RT BREAST BX W LOC DEV 1ST LESION IMG BX SPEC US  GUIDE 02/05/2023 [facility]   WISDOM TOOTH EXTRACTION      Prior to Admission medications   Medication Sig Start Date End Date Taking? Authorizing Provider  fluticasone (FLONASE) 50 MCG/ACT nasal spray Place 1 spray into both nostrils daily. 05/01/24  Yes [provider]  loratadine (CLARITIN) 10 MG tablet Take by mouth. 07/01/20  Yes [provider]  Multiple Vitamin (MULTI-VITAMIN) tablet Take 1 tablet by mouth daily. 06/25/17  Yes [provider]  norethindrone (MICRONOR) 0.35 MG tablet Take 1 tablet by mouth daily. 02/13/23  Yes [provider]  Prenatal Vit-Fe Fumarate-FA (PRENATAL MULTIVITAMIN) TABS tablet Take 1 tablet by mouth at bedtime.   Yes [provider]  EPINEPHrine 0.3 mg/0.3 mL IJ SOAJ injection Inject 0.3 mg into the muscle as needed for anaphylaxis. 05/01/24   [provider]    Current Outpatient Medications  Medication Sig Dispense Refill   fluticasone (FLONASE) 50 MCG/ACT nasal spray Place 1 spray into both nostrils daily.     loratadine  (CLARITIN) 10 MG tablet Take by mouth.     Multiple Vitamin (MULTI-VITAMIN) tablet Take 1 tablet by mouth daily.     norethindrone (MICRONOR) 0.35 MG tablet Take 1 tablet by mouth daily.     Prenatal Vit-Fe Fumarate-FA (PRENATAL MULTIVITAMIN) TABS tablet Take 1 tablet by mouth at bedtime.     EPINEPHrine 0.3 mg/0.3 mL IJ SOAJ injection Inject 0.3 mg into the muscle as needed for anaphylaxis.     Current Facility-Administered Medications  Medication Dose Route Frequency Provider Last Rate Last Admin   0.9 %  sodium chloride  infusion  500 mL Intravenous Once Sophiarose Eades V, MD        Allergies as of 06/25/2024 - Review Complete 06/25/2024  Allergen Reaction Noted   Cherry Itching, Swelling, and Other (See Comments) 07/01/2020    Family History  Problem Relation Age of Onset   Anemia Mother    Diabetes Mother    Depression Mother    Healthy Father    Breast cancer Sister 45   Asthma Sister    Diabetes Sister    Healthy Brother    Mental illness Maternal Grandmother    Colon cancer Paternal Grandfather    Breast cancer Maternal Aunt        6s    Social History   Socioeconomic History   Marital status: Married    Spouse name: Adalee Kathan   Number of children: 3   Years of education: Not on file   Highest education level: Bachelor's degree (e.g., BA, AB, BS)  Occupational History  Occupation: Energy manager  Tobacco Use   Smoking status: Never   Smokeless tobacco: Never  Vaping Use   Vaping status: Never Used  Substance and Sexual Activity   Alcohol use: No   Drug use: No   Sexual activity: Yes    Birth control/protection: None  Other Topics Concern   Not on file  Social History Narrative   Not on file   Social Drivers of Health   Financial Resource Strain: Not at Risk (12/28/2022)   Received from [employer]    Financial Resource Strain: 1  Food Insecurity: Not at Risk (03/24/2024)   Received from [facility]  Insecurity    Within the past 12 months, the food you bought just didn't last and you didn't have enough money to get more.: 1  Transportation Needs: Not at Risk (03/24/2024)   Received from [facility] Needs    In the past 12 months, has lack of transportation kept you from medical appointments, meetings, work or from getting things needed for daily living? (Check all that apply): 1  Physical Activity: Not at Risk (12/28/2022)   Received from [facility]   Physical Activity    Physical Activity: 1  Stress: Not on File (04/09/2022)   Received from [facility]   Stress    Stress: 0  Social Connections: Not on File (06/13/2023)   Received from [facility]   Social Connections    Connectedness: 0  Intimate Partner Violence: Not on file    Review of Systems:  All other review of systems negative except as mentioned in the HPI.  Physical Exam: Vital signs in last 24 hours: BP (!) 140/85 (BP Location: Right Arm, Patient Position: Sitting, Cuff Size: Normal)   Pulse 88   Temp 98 F (36.7 C) (Temporal)   Ht 5' 6 (1.676 m)   Wt 186 lb (84.4 kg)   SpO2 100%   BMI 30.02 kg/m  General:   Alert, NAD Lungs:  Clear .   Heart:  Regular rate and rhythm Abdomen:  Soft, nontender and nondistended. Neuro/Psych:  Alert and cooperative. Normal mood and affect. A and O x 3  Reviewed labs, radiology imaging, old records and pertinent past GI work up  Patient is appropriate for planned procedure(s) and anesthesia in an ambulatory setting   K. Veena Jaylee Freeze , MD [phone]

## 2024-06-25 NOTE — Progress Notes (Signed)
 [facility]  Pt's states no medical or surgical changes since previsit or office visit.

## 2024-06-26 ENCOUNTER — Telehealth: Payer: Self-pay

## 2024-06-26 NOTE — Telephone Encounter (Signed)
 Left message on answering machine.

## 2024-06-29 ENCOUNTER — Encounter: Payer: Self-pay | Admitting: Gastroenterology
